# Patient Record
Sex: Female | Born: 1980 | Race: White | Hispanic: No | Marital: Married | Smoking: Never smoker
Health system: Southern US, Community
[De-identification: ages and names within clinical notes are randomized; demographics above are authoritative.]

## PROBLEM LIST (undated history)

## (undated) DIAGNOSIS — F410 Panic disorder [episodic paroxysmal anxiety] without agoraphobia: Secondary | ICD-10-CM

## (undated) DIAGNOSIS — R42 Dizziness and giddiness: Secondary | ICD-10-CM

## (undated) DIAGNOSIS — G43909 Migraine, unspecified, not intractable, without status migrainosus: Secondary | ICD-10-CM

## (undated) HISTORY — PX: CHOLECYSTECTOMY: SHX55

---

## 2004-04-20 ENCOUNTER — Emergency Department (HOSPITAL_COMMUNITY): Admission: EM | Admit: 2004-04-20 | Discharge: 2004-04-20 | Payer: Self-pay | Admitting: Emergency Medicine

## 2004-04-27 ENCOUNTER — Emergency Department (HOSPITAL_COMMUNITY): Admission: EM | Admit: 2004-04-27 | Discharge: 2004-04-27 | Payer: Self-pay | Admitting: Emergency Medicine

## 2004-07-17 ENCOUNTER — Emergency Department (HOSPITAL_COMMUNITY): Admission: EM | Admit: 2004-07-17 | Discharge: 2004-07-18 | Payer: Self-pay

## 2006-12-08 ENCOUNTER — Emergency Department (HOSPITAL_COMMUNITY): Admission: EM | Admit: 2006-12-08 | Discharge: 2006-12-08 | Payer: Self-pay | Admitting: Emergency Medicine

## 2008-10-18 ENCOUNTER — Emergency Department (HOSPITAL_COMMUNITY): Admission: EM | Admit: 2008-10-18 | Discharge: 2008-10-19 | Payer: Self-pay | Admitting: Emergency Medicine

## 2009-04-18 ENCOUNTER — Emergency Department (HOSPITAL_COMMUNITY): Admission: EM | Admit: 2009-04-18 | Discharge: 2009-04-18 | Payer: Self-pay | Admitting: Emergency Medicine

## 2009-04-20 ENCOUNTER — Emergency Department (HOSPITAL_COMMUNITY): Admission: EM | Admit: 2009-04-20 | Discharge: 2009-04-20 | Payer: Self-pay | Admitting: Emergency Medicine

## 2011-01-09 ENCOUNTER — Emergency Department (HOSPITAL_COMMUNITY): Payer: Self-pay

## 2011-01-09 ENCOUNTER — Emergency Department (HOSPITAL_COMMUNITY)
Admission: EM | Admit: 2011-01-09 | Discharge: 2011-01-09 | Disposition: A | Discharge status: Home / Self Care | Payer: Self-pay | Attending: Emergency Medicine | Admitting: Emergency Medicine

## 2011-01-09 DIAGNOSIS — S93409A Sprain of unspecified ligament of unspecified ankle, initial encounter: Secondary | ICD-10-CM | POA: Insufficient documentation

## 2011-01-09 DIAGNOSIS — W010XXA Fall on same level from slipping, tripping and stumbling without subsequent striking against object, initial encounter: Secondary | ICD-10-CM | POA: Insufficient documentation

## 2011-01-09 DIAGNOSIS — Y998 Other external cause status: Secondary | ICD-10-CM | POA: Insufficient documentation

## 2011-01-14 LAB — CBC
HCT: 41.7 % (ref 36.0–46.0)
Hemoglobin: 13.9 g/dL (ref 12.0–15.0)
MCHC: 33.3 g/dL (ref 30.0–36.0)
MCV: 89.2 fL (ref 78.0–100.0)
Platelets: 356 10*3/uL (ref 150–400)
RBC: 4.68 MIL/uL (ref 3.87–5.11)
RDW: 13.8 % (ref 11.5–15.5)
WBC: 14.2 10*3/uL — ABNORMAL HIGH (ref 4.0–10.5)

## 2011-01-14 LAB — DIFFERENTIAL
Basophils Absolute: 0.1 10*3/uL (ref 0.0–0.1)
Basophils Relative: 1 % (ref 0–1)
Eosinophils Absolute: 0.2 10*3/uL (ref 0.0–0.7)
Eosinophils Relative: 2 % (ref 0–5)
Lymphocytes Relative: 19 % (ref 12–46)
Lymphs Abs: 2.7 10*3/uL (ref 0.7–4.0)
Monocytes Absolute: 0.8 10*3/uL (ref 0.1–1.0)
Monocytes Relative: 6 % (ref 3–12)
Neutro Abs: 10.2 10*3/uL — ABNORMAL HIGH (ref 1.7–7.7)
Neutrophils Relative %: 72 % (ref 43–77)

## 2011-01-14 LAB — WET PREP, GENITAL
Trich, Wet Prep: NONE SEEN
WBC, Wet Prep HPF POC: NONE SEEN
Yeast Wet Prep HPF POC: NONE SEEN

## 2011-01-14 LAB — URINE MICROSCOPIC-ADD ON

## 2011-01-14 LAB — URINALYSIS, ROUTINE W REFLEX MICROSCOPIC
Bilirubin Urine: NEGATIVE
Glucose, UA: NEGATIVE mg/dL
Leukocytes, UA: NEGATIVE
Nitrite: NEGATIVE
Specific Gravity, Urine: 1.025 (ref 1.005–1.030)
Urobilinogen, UA: 0.2 mg/dL (ref 0.0–1.0)
pH: 6 (ref 5.0–8.0)

## 2011-01-14 LAB — GC/CHLAMYDIA PROBE AMP, GENITAL
Chlamydia, DNA Probe: NEGATIVE
GC Probe Amp, Genital: NEGATIVE

## 2011-01-14 LAB — RPR: RPR Ser Ql: NONREACTIVE

## 2011-01-14 LAB — PREGNANCY, URINE: Preg Test, Ur: NEGATIVE

## 2011-06-13 ENCOUNTER — Emergency Department (HOSPITAL_COMMUNITY)
Admission: EM | Admit: 2011-06-13 | Discharge: 2011-06-13 | Disposition: A | Discharge status: Home / Self Care | Payer: Self-pay | Attending: Emergency Medicine | Admitting: Emergency Medicine

## 2011-06-13 ENCOUNTER — Encounter: Payer: Self-pay | Admitting: Emergency Medicine

## 2011-06-13 DIAGNOSIS — N939 Abnormal uterine and vaginal bleeding, unspecified: Secondary | ICD-10-CM

## 2011-06-13 DIAGNOSIS — N920 Excessive and frequent menstruation with regular cycle: Secondary | ICD-10-CM | POA: Insufficient documentation

## 2011-06-13 LAB — URINALYSIS, ROUTINE W REFLEX MICROSCOPIC
Glucose, UA: NEGATIVE mg/dL
Ketones, ur: 15 mg/dL — AB
Leukocytes, UA: NEGATIVE
Nitrite: NEGATIVE
Protein, ur: NEGATIVE mg/dL
Specific Gravity, Urine: 1.02 (ref 1.005–1.030)
Urobilinogen, UA: 0.2 mg/dL (ref 0.0–1.0)
pH: 6.5 (ref 5.0–8.0)

## 2011-06-13 LAB — POCT I-STAT, CHEM 8
BUN: 8 mg/dL (ref 6–23)
Calcium, Ion: 1.16 mmol/L (ref 1.12–1.32)
Chloride: 106 mEq/L (ref 96–112)
Creatinine, Ser: 0.6 mg/dL (ref 0.50–1.10)
Glucose, Bld: 92 mg/dL (ref 70–99)
HCT: 44 % (ref 36.0–46.0)
Hemoglobin: 15 g/dL (ref 12.0–15.0)
Potassium: 3.7 mEq/L (ref 3.5–5.1)
Sodium: 140 mEq/L (ref 135–145)
TCO2: 20 mmol/L (ref 0–100)

## 2011-06-13 LAB — URINE MICROSCOPIC-ADD ON

## 2011-06-13 LAB — WET PREP, GENITAL
Clue Cells Wet Prep HPF POC: NONE SEEN
Trich, Wet Prep: NONE SEEN
Yeast Wet Prep HPF POC: NONE SEEN

## 2011-06-13 LAB — POCT PREGNANCY, URINE: Preg Test, Ur: NEGATIVE

## 2011-06-13 MED ORDER — IBUPROFEN 400 MG PO TABS
400.0000 mg | ORAL_TABLET | Freq: Once | ORAL | Status: AC
  Administered 2011-06-13: 400 mg via ORAL
  Filled 2011-06-13: qty 1

## 2011-06-13 NOTE — ED Notes (Signed)
Pt c/o abd cramping and heavy vaginal bleeding that started an hour ago.

## 2011-06-13 NOTE — ED Provider Notes (Signed)
History     CSN: [id number] Arrival date & time: 06/13/2011  4:21 PM   Chief Complaint  Patient presents with  . Abdominal Cramping  . Vaginal Bleeding     HPI Pt was seen at 1635.  Per pt, c/o gradual onset and persistence of constant vaginal bleeding that began a few hours PTA.  Pt describes the vaginal bleeding as "heavy," has been assoc with abd/pelvic "cramping" pain.  Has hx of irregular menses, unsure if this is per the time of her usual menses.  Denies CP/SOB, no fevers, no back pain, no N/V/D.   History reviewed. No pertinent past medical history.   Past Surgical History  Procedure Date  . Cholecystectomy     History  Substance Use Topics  . Smoking status: Never Smoker   . Smokeless tobacco: Not on file  . Alcohol Use: No    Review of Systems ROS: Statement: All systems negative except as marked or noted in the HPI; Constitutional: Negative for fever and chills. ; ; Eyes: Negative for eye pain, redness and discharge. ; ; ENMT: Negative for ear pain, hoarseness, nasal congestion, sinus pressure and sore throat. ; ; Cardiovascular: Negative for chest pain, palpitations, diaphoresis, dyspnea and peripheral edema. ; ; Respiratory: Negative for cough, wheezing and stridor. ; ; Gastrointestinal: +abd/pelvic cramping.  Negative for nausea, vomiting, diarrhea, blood in stool, hematemesis, jaundice and rectal bleeding. . ; ; Genitourinary: Negative for dysuria, flank pain and hematuria. +vaginal bleeding. ; ; Musculoskeletal: Negative for back pain and neck pain. Negative for swelling and trauma.; ; Skin: Negative for pruritus, rash, abrasions, blisters, bruising and skin lesion.; ; Neuro: Negative for headache, lightheadedness and neck stiffness. Negative for weakness, altered level of consciousness , altered mental status, extremity weakness, paresthesias, involuntary movement, seizure and syncope.     Physical Exam    BP 130/81  Pulse 93  Temp(Src) 99.2 F (37.3 C) (Oral)   Resp 20  Ht 5\' 2"  (1.575 m)  Wt 265 lb (120.203 kg)  BMI 48.47 kg/m2  SpO2 98%  LMP 06/12/2011  Physical Exam 1640: Physical examination:  Nursing notes reviewed; Vital signs and O2 SAT reviewed;  Constitutional: Well developed, Well nourished, Well hydrated, In no acute distress; Head:  Normocephalic, atraumatic; Eyes: EOMI, PERRL, No scleral icterus; ENMT: Mouth and pharynx normal, Mucous membranes moist; Neck: Supple, Full range of motion, No lymphadenopathy; Cardiovascular: Regular rate and rhythm, No murmur, rub, or gallop; Respiratory: Breath sounds clear & equal bilaterally, No rales, rhonchi, wheezes, or rub, Normal respiratory effort/excursion; Chest: Nontender, Movement normal; Abdomen: Soft, Nontender, Nondistended, Normal bowel sounds; Pelvic exam performed with permission of pt and female ED tech assist during exam.  External genitalia w/o lesions. Vaginal vault without discharge, small amount of dark blood in vault.  Cervix w/o lesions, not friable, GC/chlam and wet prep obtained and sent to lab.  Bimanual exam w/o CMT, uterine or adenexal tenderness. Extremities: Pulses normal, No tenderness, No edema, No calf edema or asymmetry.; Neuro: AA&Ox3, Major CN grossly intact. Speech clear, no facial droop.  No gross focal motor or sensory deficits in extremities.; Skin: Color normal, Warm, Dry.    ED Course  Procedures   MDM MDM Reviewed: nursing note and vitals Interpretation: labs   Results for orders placed during the hospital encounter of 06/13/11  URINALYSIS, ROUTINE W REFLEX MICROSCOPIC      Component Value Range   Color, Urine YELLOW  YELLOW    Appearance CLEAR  CLEAR    Specific  Gravity, Urine 1.020  1.005 - 1.030    pH 6.5  5.0 - 8.0    Glucose, UA NEGATIVE  NEGATIVE (mg/dL)   Hgb urine dipstick MODERATE (*) NEGATIVE    Bilirubin Urine SMALL (*) NEGATIVE    Ketones, ur 15 (*) NEGATIVE (mg/dL)   Protein, ur NEGATIVE  NEGATIVE (mg/dL)   Urobilinogen, UA 0.2  0.0 -  1.0 (mg/dL)   Nitrite NEGATIVE  NEGATIVE    Leukocytes, UA NEGATIVE  NEGATIVE   WET PREP, GENITAL      Component Value Range   Yeast, Wet Prep NONE SEEN  NONE SEEN    Trich, Wet Prep NONE SEEN  NONE SEEN    Clue Cells, Wet Prep NONE SEEN  NONE SEEN    WBC, Wet Prep HPF POC FEW (*) NONE SEEN   POCT PREGNANCY, URINE      Component Value Range   Preg Test, Ur NEGATIVE    URINE MICROSCOPIC-ADD ON      Component Value Range   Squamous Epithelial / LPF FEW (*) RARE    WBC, UA 0-2  <3 (WBC/hpf)   RBC / HPF 3-6  <3 (RBC/hpf)   Bacteria, UA RARE  RARE   POCT I-STAT, CHEM 8      Component Value Range   Sodium 140  135 - 145 (mEq/L)   Potassium 3.7  3.5 - 5.1 (mEq/L)   Chloride 106  96 - 112 (mEq/L)   BUN 8  6 - 23 (mg/dL)   Creatinine, Ser 1.61  0.50 - 1.10 (mg/dL)   Glucose, Bld 92  70 - 99 (mg/dL)   Calcium, Ion 0.96  0.45 - 1.32 (mmol/L)   TCO2 20  0 - 100 (mmol/L)   Hemoglobin 15.0  12.0 - 15.0 (g/dL)   HCT 40.9  81.1 - 91.4 (%)    6:43 PM:  Has menses, UA contaminated.  H/H normal, VSS.  Wants to go home now.  Dx testing d/w pt and family.  Questions answered.  Verb understanding, agreeable to d/c home with outpt f/u.    I personally performed the services described in this documentation, which was scribed in my presence. The recorded information has been reviewed and considered. [facility] ([place]) M    Verl Whitmore Allison Quarry, DO 06/13/11 2313

## 2011-06-13 NOTE — ED Notes (Signed)
Pt states did not have vaginal bleeding yesterday and today started cramping with excessive bleeding. Pt states was "thinking she was pregnant and planned on buying a pregnancy test this weekend. Pt continues to have excessive bleeding using 5 sanitary napkins since 2 pm. Pt states stomach cramping is still intense.

## 2011-06-14 LAB — GC/CHLAMYDIA PROBE AMP, GENITAL
Chlamydia, DNA Probe: NEGATIVE
GC Probe Amp, Genital: NEGATIVE

## 2011-08-26 ENCOUNTER — Emergency Department (HOSPITAL_COMMUNITY)
Admission: EM | Admit: 2011-08-26 | Discharge: 2011-08-26 | Disposition: A | Discharge status: Home / Self Care | Payer: Self-pay | Attending: Emergency Medicine | Admitting: Emergency Medicine

## 2011-08-26 ENCOUNTER — Encounter (HOSPITAL_COMMUNITY): Payer: Self-pay | Admitting: *Deleted

## 2011-08-26 DIAGNOSIS — R300 Dysuria: Secondary | ICD-10-CM | POA: Insufficient documentation

## 2011-08-26 DIAGNOSIS — R109 Unspecified abdominal pain: Secondary | ICD-10-CM | POA: Insufficient documentation

## 2011-08-26 DIAGNOSIS — N390 Urinary tract infection, site not specified: Secondary | ICD-10-CM | POA: Insufficient documentation

## 2011-08-26 DIAGNOSIS — R350 Frequency of micturition: Secondary | ICD-10-CM | POA: Insufficient documentation

## 2011-08-26 LAB — URINALYSIS, ROUTINE W REFLEX MICROSCOPIC
Bilirubin Urine: NEGATIVE
Glucose, UA: NEGATIVE mg/dL
Ketones, ur: NEGATIVE mg/dL
Leukocytes, UA: NEGATIVE
Nitrite: NEGATIVE
Protein, ur: NEGATIVE mg/dL
Specific Gravity, Urine: 1.02 (ref 1.005–1.030)
Urobilinogen, UA: 0.2 mg/dL (ref 0.0–1.0)
pH: 6.5 (ref 5.0–8.0)

## 2011-08-26 LAB — PREGNANCY, URINE: Preg Test, Ur: NEGATIVE

## 2011-08-26 LAB — URINE MICROSCOPIC-ADD ON

## 2011-08-26 MED ORDER — NITROFURANTOIN MONOHYD MACRO 100 MG PO CAPS
100.0000 mg | ORAL_CAPSULE | Freq: Once | ORAL | Status: AC
  Administered 2011-08-26: 100 mg via ORAL
  Filled 2011-08-26: qty 1

## 2011-08-26 MED ORDER — PHENAZOPYRIDINE HCL 100 MG PO TABS
200.0000 mg | ORAL_TABLET | Freq: Once | ORAL | Status: AC
  Administered 2011-08-26: 200 mg via ORAL
  Filled 2011-08-26: qty 2

## 2011-08-26 MED ORDER — NITROFURANTOIN MONOHYD MACRO 100 MG PO CAPS: 100.0000 mg | ORAL_CAPSULE | Freq: Two times a day (BID) | ORAL | Status: AC

## 2011-08-26 MED ORDER — PHENAZOPYRIDINE HCL 200 MG PO TABS: 200.0000 mg | ORAL_TABLET | Freq: Three times a day (TID) | ORAL | Status: AC

## 2011-08-26 NOTE — ED Provider Notes (Signed)
History     CSN: [id number] Arrival date & time: 08/26/2011  2:21 PM   First MD Initiated Contact with Patient 08/26/11 1438      Chief Complaint  Patient presents with  . Urinary Frequency    (Consider location/radiation/quality/duration/timing/severity/associated sxs/prior treatment) Patient is a 30 y.o. female presenting with frequency. The history is provided by the patient. No language interpreter was used.  Urinary Frequency This is a new problem. Episode onset: 3-4 days ago. The problem occurs constantly. The problem has been gradually worsening. Associated symptoms include urinary symptoms. Pertinent negatives include no diaphoresis, fever, nausea, numbness or rash. Exacerbated by: palpation. She has tried nothing for the symptoms.    History reviewed. No pertinent past medical history.  Past Surgical History  Procedure Date  . Cholecystectomy     History reviewed. No pertinent family history.  History  Substance Use Topics  . Smoking status: Never Smoker   . Smokeless tobacco: Not on file  . Alcohol Use: No    OB History    Grav Para Term Preterm Abortions TAB SAB Ect Mult Living                  Review of Systems  Constitutional: Negative for fever and diaphoresis.  Gastrointestinal: Negative for nausea.  Genitourinary: Positive for dysuria, urgency and frequency. Negative for hematuria.  Skin: Negative for rash.  Neurological: Negative for numbness.  All other systems reviewed and are negative.    Allergies  Zithromax  Home Medications   Current Outpatient Rx  Name Route Sig Dispense Refill  . ACETAMINOPHEN 500 MG PO TABS Oral Take 500 mg by mouth as needed. For headache pain     . THERA M PLUS PO TABS Oral Take 1 tablet by mouth daily.        BP 122/57  Pulse 85  Temp(Src) 98.4 F (36.9 C) (Oral)  Resp 24  Ht 5\' 1"  (1.549 m)  Wt 269 lb (122.018 kg)  BMI 50.83 kg/m2  SpO2 99%  LMP 07/26/2011  Physical Exam  Nursing note and vitals  reviewed. Constitutional: She is oriented to person, place, and time. She appears well-developed and well-nourished. No distress.  HENT:  Head: Normocephalic and atraumatic.  Eyes: EOM are normal.  Neck: Normal range of motion.  Cardiovascular: Normal rate, regular rhythm and normal heart sounds.   Pulmonary/Chest: Effort normal and breath sounds normal.  Abdominal: Soft. Normal appearance and bowel sounds are normal. She exhibits no distension. There is no hepatosplenomegaly. There is tenderness in the suprapubic area. There is no rigidity, no rebound, no guarding and no CVA tenderness.    Genitourinary: No vaginal discharge found.  Musculoskeletal: Normal range of motion.  Neurological: She is alert and oriented to person, place, and time.  Skin: Skin is warm and dry. She is not diaphoretic.  Psychiatric: She has a normal mood and affect. Judgment normal.    ED Course  Procedures (including critical care time)  Labs Reviewed  URINALYSIS, ROUTINE W REFLEX MICROSCOPIC - Abnormal; Notable for the following:    Hgb urine dipstick TRACE (*)    All other components within normal limits  URINE MICROSCOPIC-ADD ON - Abnormal; Notable for the following:    Squamous Epithelial / LPF MANY (*)    Bacteria, UA MANY (*)    All other components within normal limits  PREGNANCY, URINE  URINE CULTURE   No results found.   No diagnosis found.    MDM  Worthy Rancher, PA 08/26/11 [phone]

## 2011-08-26 NOTE — ED Notes (Signed)
Pt c/o urinary frequency, painful urination, lower abdominal pain and back pain since Saturday.

## 2011-08-27 NOTE — ED Provider Notes (Signed)
Medical screening examination/treatment/procedure(s) were performed by non-physician practitioner and as supervising physician I was immediately available for consultation/collaboration.  Toy Baker, MD 08/27/11 2136

## 2011-08-28 LAB — URINE CULTURE
Colony Count: NO GROWTH
Culture  Setup Time: 201211272157
Culture: NO GROWTH

## 2011-12-24 ENCOUNTER — Encounter (HOSPITAL_COMMUNITY): Payer: Self-pay | Admitting: *Deleted

## 2011-12-24 ENCOUNTER — Emergency Department (HOSPITAL_COMMUNITY)
Admission: EM | Admit: 2011-12-24 | Discharge: 2011-12-24 | Disposition: A | Discharge status: Home / Self Care | Payer: Self-pay | Attending: Emergency Medicine | Admitting: Emergency Medicine

## 2011-12-24 ENCOUNTER — Other Ambulatory Visit: Payer: Self-pay

## 2011-12-24 ENCOUNTER — Emergency Department (HOSPITAL_COMMUNITY): Payer: Self-pay

## 2011-12-24 DIAGNOSIS — R079 Chest pain, unspecified: Secondary | ICD-10-CM | POA: Insufficient documentation

## 2011-12-24 DIAGNOSIS — R05 Cough: Secondary | ICD-10-CM | POA: Insufficient documentation

## 2011-12-24 DIAGNOSIS — R059 Cough, unspecified: Secondary | ICD-10-CM | POA: Insufficient documentation

## 2011-12-24 HISTORY — DX: Panic disorder (episodic paroxysmal anxiety): F41.0

## 2011-12-24 NOTE — ED Notes (Signed)
Patient reports onset of fluttering and pain in her chest last night.  She states it went away and returned today.  Patient denies sob.

## 2011-12-24 NOTE — ED Provider Notes (Signed)
I saw and evaluated the patient, reviewed the resident's note and I agree with the findings and plan.  31 year old morbidly obese female, with productive cough, and intermittent left-sided chest pain, for a few days.  She denies nausea, vomiting, fevers, chills, leg pain or swelling.  She has not had recent trip or surgery and she denies using birth control pills.  She is a nonsmoker.  On examination.  Her lungs are clear.  She does have left sided abdominal tenderness to palpation she is in no distress.  We'll perform a chest x-ray, to look for pneumonia, versus bronchitis, and then decide upon treatment.  She will be able to go home  Cheri Guppy, MD 12/24/11 1024

## 2011-12-24 NOTE — ED Provider Notes (Signed)
I saw and evaluated the patient, reviewed the resident's note and I agree with the findings and plan.  Keimani Laufer, MD 12/24/11 1600 

## 2011-12-24 NOTE — ED Provider Notes (Signed)
History     CSN: [id number]  Arrival date & time 12/24/11  4098   First MD Initiated Contact with Patient 12/24/11 [phone]      Chief Complaint  Patient presents with  . Chest Pain  . Palpitations    (Consider location/radiation/quality/duration/timing/severity/associated sxs/prior treatment) HPI CC chest pain onset last night, moderate, sharp, left sided, associated with cough.  Pt also states she has felt heart fluttering as well related to the pain.  Pt denies sob.  Past Medical History  Diagnosis Date  . Panic attacks     Past Surgical History  Procedure Date  . Cholecystectomy     No family history on file.  History  Substance Use Topics  . Smoking status: Never Smoker   . Smokeless tobacco: Not on file  . Alcohol Use: No    OB History    Grav Para Term Preterm Abortions TAB SAB Ect Mult Living                  Review of Systems  Constitutional: Negative for fever and chills.  All other systems reviewed and are negative.    Allergies  Zithromax  Home Medications   Current Outpatient Rx  Name Route Sig Dispense Refill  . ACETAMINOPHEN 500 MG PO TABS Oral Take 500 mg by mouth as needed. For headache pain     . THERA M PLUS PO TABS Oral Take 1 tablet by mouth daily.        BP 103/67  Pulse 76  Temp(Src) 98.9 F (37.2 C) (Oral)  Resp 16  Ht 5\' 1"  (1.549 m)  Wt 260 lb (117.935 kg)  BMI 49.13 kg/m2  SpO2 100%ra wnl  Physical Exam  Nursing note and vitals reviewed. Constitutional: She appears well-developed and well-nourished.  HENT:  Head: Normocephalic and atraumatic.  Eyes: Right eye exhibits no discharge. Left eye exhibits no discharge.  Cardiovascular: Normal rate, regular rhythm and normal heart sounds.   Pulmonary/Chest: Effort normal and breath sounds normal.  Abdominal: Soft. There is no tenderness.  Neurological: She is alert.  Skin: Skin is warm and dry.    ED Course  Procedures (including critical care time)  Labs Reviewed  - No data to display Dg Chest 2 View  12/24/2011  *RADIOLOGY REPORT*  Clinical Data: Chest pain, palpitations.  CHEST - 2 VIEW  Comparison: None  Findings: Low lung volumes. Heart and mediastinal contours are within normal limits.  No focal opacities or effusions.  No acute bony abnormality.  IMPRESSION: Low volumes. No active cardiopulmonary disease.  Original Report Authenticated By: Cyndie Chime, M.D.     1. Cough   2. Chest pain       MDM  Pt is in nad, afvss, nontoxic appearing, exam and hx as above. PERC negative.  EKG nl, getting cxr.  cxr nl, results and plan d/w pt, return warnings given      Elijio Miles, MD 12/24/11 1036

## 2011-12-24 NOTE — Discharge Instructions (Signed)
Chest Pain, Nonspecific It is often hard to give a specific diagnosis for the cause of chest pain. There is always a chance that your pain could be related to something serious, like a heart attack or a blood clot in the lungs. You need to follow up with your caregiver for further evaluation. More lab tests or other studies such as X-rays, electrocardiography, stress testing, or cardiac imaging may be needed to find the cause of your pain. Most of the time, nonspecific chest pain improves within 2 to 3 days with rest and mild pain medicine. For the next few days, avoid physical exertion or activities that bring on pain. Do not smoke. Avoid drinking alcohol. Call your caregiver for routine follow-up as advised.  SEEK IMMEDIATE MEDICAL CARE IF:  You develop increased chest pain or pain that radiates to the arm, neck, jaw, back, or abdomen.   You develop shortness of breath, increased coughing, or you start coughing up blood.   You have severe back or abdominal pain, nausea, or vomiting.   You develop severe weakness, fainting, fever, or chills.  Document Released: 09/16/2005 Document Revised: 09/05/2011 Document Reviewed: 03/06/2007 [facility] Patient Information 2012 [place], [place].Cough, Adult  A cough is a reflex. It helps you clear your throat and airways. A cough can help heal your body. A cough can last 2 or 3 weeks (acute) or may last more than 8 weeks (chronic). Some common causes of a cough can include an infection, allergy, or a cold. HOME CARE  Only take medicine as told by your doctor.   If given, take your medicines (antibiotics) as told. Finish them even if you start to feel better.   Use a cold steam vaporizer or humidier in your home. This can help loosen thick spit (secretions).   Sleep so you are almost sitting up (semi-upright). Use pillows to do this. This helps reduce coughing.   Rest as needed.   Stop smoking if you smoke.  GET HELP RIGHT AWAY IF:  You have  yellowish-white fluid (pus) in your thick spit.   Your cough gets worse.   Your medicine does not reduce coughing, and you are losing sleep.   You cough up blood.   You have trouble breathing.   Your pain gets worse and medicine does not help.   You have a fever.  MAKE SURE YOU:   Understand these instructions.   Will watch your condition.   Will get help right away if you are not doing well or get worse.  Document Released: 05/30/2011 Document Revised: 09/05/2011 Document Reviewed: 05/30/2011 [facility] Patient Information 2012 [place], [place].  Return for any new or worsening symptoms or any other concerns.

## 2012-01-16 ENCOUNTER — Encounter (HOSPITAL_COMMUNITY): Payer: Self-pay | Admitting: Emergency Medicine

## 2012-01-16 ENCOUNTER — Emergency Department (HOSPITAL_COMMUNITY): Payer: Self-pay

## 2012-01-16 ENCOUNTER — Emergency Department (HOSPITAL_COMMUNITY)
Admission: EM | Admit: 2012-01-16 | Discharge: 2012-01-16 | Disposition: A | Discharge status: Home / Self Care | Payer: Self-pay | Attending: Emergency Medicine | Admitting: Emergency Medicine

## 2012-01-16 DIAGNOSIS — IMO0002 Reserved for concepts with insufficient information to code with codable children: Secondary | ICD-10-CM | POA: Insufficient documentation

## 2012-01-16 DIAGNOSIS — M79609 Pain in unspecified limb: Secondary | ICD-10-CM | POA: Insufficient documentation

## 2012-01-16 DIAGNOSIS — M545 Low back pain, unspecified: Secondary | ICD-10-CM | POA: Insufficient documentation

## 2012-01-16 DIAGNOSIS — M5416 Radiculopathy, lumbar region: Secondary | ICD-10-CM

## 2012-01-16 MED ORDER — PREDNISONE 50 MG PO TABS: 50.0000 mg | ORAL_TABLET | Freq: Every day | ORAL | Status: AC

## 2012-01-16 MED ORDER — PREDNISONE 20 MG PO TABS
60.0000 mg | ORAL_TABLET | Freq: Once | ORAL | Status: AC
  Administered 2012-01-16: 60 mg via ORAL
  Filled 2012-01-16: qty 3

## 2012-01-16 MED ORDER — HYDROCODONE-ACETAMINOPHEN 5-325 MG PO TABS
1.0000 | ORAL_TABLET | Freq: Once | ORAL | Status: AC
  Administered 2012-01-16: 1 via ORAL
  Filled 2012-01-16: qty 1

## 2012-01-16 MED ORDER — HYDROCODONE-ACETAMINOPHEN 5-325 MG PO TABS: 1.0000 | ORAL_TABLET | Freq: Four times a day (QID) | ORAL | Status: AC | PRN

## 2012-01-16 NOTE — ED Provider Notes (Signed)
Medical screening examination/treatment/procedure(s) were performed by non-physician practitioner and as supervising physician I was immediately available for consultation/collaboration.  Flint Melter, MD 01/16/12 [phone]

## 2012-01-16 NOTE — ED Notes (Signed)
Pt states back and right leg pain since yesterday. Pain shooting down leg. Pt states has had before but not this bad.

## 2012-01-16 NOTE — ED Notes (Signed)
Pt alert and oriented x 3. Skin warm and dry. Color pink. Pt c/o pain in her lower back that radiates down her right leg since yesterday. Denies injury.

## 2012-01-16 NOTE — Discharge Instructions (Signed)
Back Pain, Adult Low back pain is very common. About 1 in 5 people have back pain.The cause of low back pain is rarely dangerous. The pain often gets better over time.About half of people with a sudden onset of back pain feel better in just 2 weeks. About 8 in 10 people feel better by 6 weeks.  CAUSES Some common causes of back pain include:  Strain of the muscles or ligaments supporting the spine.   Wear and tear (degeneration) of the spinal discs.   Arthritis.   Direct injury to the back.  DIAGNOSIS Most of the time, the direct cause of low back pain is not known.However, back pain can be treated effectively even when the exact cause of the pain is unknown.Answering your caregiver's questions about your overall health and symptoms is one of the most accurate ways to make sure the cause of your pain is not dangerous. If your caregiver needs more information, he or she may order lab work or imaging tests (X-rays or MRIs).However, even if imaging tests show changes in your back, this usually does not require surgery. HOME CARE INSTRUCTIONS For many people, back pain returns.Since low back pain is rarely dangerous, it is often a condition that people can learn to manageon their own.   Remain active. It is stressful on the back to sit or stand in one place. Do not sit, drive, or stand in one place for more than 30 minutes at a time. Take short walks on level surfaces as soon as pain allows.Try to increase the length of time you walk each day.   Do not stay in bed.Resting more than 1 or 2 days can delay your recovery.   Do not avoid exercise or work.Your body is made to move.It is not dangerous to be active, even though your back may hurt.Your back will likely heal faster if you return to being active before your pain is gone.   Pay attention to your body when you bend and lift. Many people have less discomfortwhen lifting if they bend their knees, keep the load close to their  bodies,and avoid twisting. Often, the most comfortable positions are those that put less stress on your recovering back.   Find a comfortable position to sleep. Use a firm mattress and lie on your side with your knees slightly bent. If you lie on your back, put a pillow under your knees.   Only take over-the-counter or prescription medicines as directed by your caregiver. Over-the-counter medicines to reduce pain and inflammation are often the most helpful.Your caregiver may prescribe muscle relaxant drugs.These medicines help dull your pain so you can more quickly return to your normal activities and healthy exercise.   Put ice on the injured area.   Put ice in a plastic bag.   Place a towel between your skin and the bag.   Leave the ice on for 15 to 20 minutes, 3 to 4 times a day for the first 2 to 3 days. After that, ice and heat may be alternated to reduce pain and spasms.   Ask your caregiver about trying back exercises and gentle massage. This may be of some benefit.   Avoid feeling anxious or stressed.Stress increases muscle tension and can worsen back pain.It is important to recognize when you are anxious or stressed and learn ways to manage it.Exercise is a great option.  SEEK MEDICAL CARE IF:  You have pain that is not relieved with rest or medicine.   You have   pain that does not improve in 1 week.   You have new symptoms.   You are generally not feeling well.  SEEK IMMEDIATE MEDICAL CARE IF:   You have pain that radiates from your back into your legs.   You develop new bowel or bladder control problems.   You have unusual weakness or numbness in your arms or legs.   You develop nausea or vomiting.   You develop abdominal pain.   You feel faint.  Document Released: 09/16/2005 Document Revised: 09/05/2011 Document Reviewed: 02/04/2011 [facility] Patient Information 2012 [employer], [place].   The radiologist notes some mild degenerative changes but otherwise no  significant abnormalities.  Take the meds as directed.  Do NOT take any NSAID's while taking the prednisone.  Apply ice several times daily to lower back.  Make arrangements for follow up at the health dept.

## 2012-01-16 NOTE — ED Provider Notes (Signed)
History     CSN: [id number]  Arrival date & time 01/16/12  4098   First MD Initiated Contact with Patient 01/16/12 [phone]      Chief Complaint  Patient presents with  . Back Pain  . Leg Pain    (Consider location/radiation/quality/duration/timing/severity/associated sxs/prior treatment) HPI Comments: States she has had intermittent/episodic low back pain since falling off a trampoline about 15 yrs ago.  No recent trauma.  LMP last month.  "i am not pregnant".    Patient is a 31 y.o. female presenting with back pain and leg pain. The history is provided by the patient. No language interpreter was used.  Back Pain  This is a new problem. The current episode started yesterday. The problem occurs constantly. The problem has been gradually worsening. The pain is associated with no known injury. The pain is present in the lumbar spine and gluteal region. Quality: nagging. The pain radiates to the right foot. The pain is severe. The pain is the same all the time. Associated symptoms include leg pain. Pertinent negatives include no numbness, no bowel incontinence, no perianal numbness, no paresthesias, no paresis, no tingling and no weakness. The treatment provided no (took 1 "tinazadine" last PM.  one of her husband's muscle relaxants.) relief.  Leg Pain  Pertinent negatives include no numbness and no tingling.    Past Medical History  Diagnosis Date  . Panic attacks     Past Surgical History  Procedure Date  . Cholecystectomy     No family history on file.  History  Substance Use Topics  . Smoking status: Never Smoker   . Smokeless tobacco: Not on file  . Alcohol Use: No    OB History    Grav Para Term Preterm Abortions TAB SAB Ect Mult Living                  Review of Systems  Gastrointestinal: Negative for bowel incontinence.  Musculoskeletal: Positive for back pain.  Neurological: Negative for tingling, weakness, numbness and paresthesias.  All other systems reviewed  and are negative.    Allergies  Zithromax  Home Medications   Current Outpatient Rx  Name Route Sig Dispense Refill  . ACETAMINOPHEN 500 MG PO TABS Oral Take 500 mg by mouth as needed. For headache pain     . HYDROCODONE-ACETAMINOPHEN 5-325 MG PO TABS Oral Take 1 tablet by mouth every 6 (six) hours as needed for pain. 20 tablet 0  . PREDNISONE 50 MG PO TABS Oral Take 1 tablet (50 mg total) by mouth daily. 6 tablet 0    BP 141/97  Pulse 80  Temp(Src) 98.3 F (36.8 C) (Oral)  Resp 20  Ht 5\' 2"  (1.575 m)  Wt 260 lb (117.935 kg)  BMI 47.55 kg/m2  SpO2 98%  LMP 12/09/2011  Physical Exam  Nursing note and vitals reviewed. Constitutional: She is oriented to person, place, and time. She appears well-developed and well-nourished. No distress.  HENT:  Head: Normocephalic and atraumatic.  Eyes: EOM are normal.  Neck: Normal range of motion.  Cardiovascular: Normal rate, regular rhythm and normal heart sounds.   Pulmonary/Chest: Effort normal and breath sounds normal.  Abdominal: Soft. She exhibits no distension. There is no tenderness.  Musculoskeletal: Normal range of motion.       Arms: Neurological: She is alert and oriented to person, place, and time. She has normal strength. No sensory deficit. Coordination normal. GCS eye subscore is 4. GCS verbal subscore is 5. GCS motor  subscore is 6.  Reflex Scores:      Patellar reflexes are 2+ on the right side and 2+ on the left side.      Achilles reflexes are 2+ on the right side and 2+ on the left side. Skin: Skin is warm and dry.  Psychiatric: She has a normal mood and affect. Judgment normal.    ED Course  Procedures (including critical care time)  Labs Reviewed - No data to display Dg Lumbar Spine Complete  01/16/2012  *RADIOLOGY REPORT*  Clinical Data: Back and right leg pain.  LUMBAR SPINE - COMPLETE 4+ VIEW  Comparison: None.  Findings: Alignment is anatomic.  Vertebral body height is maintained.  Mild endplate  degenerative changes in the lower thoracic spine, as well as at L3-4, where there is also mild loss of disc space height.  There may be mild loss of disc space height at L5-S1 also.  No definite pars defects.  IMPRESSION: Mild scattered spondylosis.  Original Report Authenticated By: Reyes Ivan, M.D.     1. Lumbar back pain   2. Lumbar radiculopathy       MDM  No significant bony abnorm Ice rx-predisone 50 mg, 6 rx- hydrocodone, 20 F/u with PCP at [facility], [place] 01/16/12 1126  Worthy Rancher, PA 01/16/12 [phone]

## 2012-01-28 ENCOUNTER — Emergency Department (HOSPITAL_COMMUNITY)
Admission: EM | Admit: 2012-01-28 | Discharge: 2012-01-28 | Disposition: A | Discharge status: Home / Self Care | Payer: Self-pay | Attending: Emergency Medicine | Admitting: Emergency Medicine

## 2012-01-28 ENCOUNTER — Encounter (HOSPITAL_COMMUNITY): Payer: Self-pay

## 2012-01-28 DIAGNOSIS — R509 Fever, unspecified: Secondary | ICD-10-CM | POA: Insufficient documentation

## 2012-01-28 DIAGNOSIS — R112 Nausea with vomiting, unspecified: Secondary | ICD-10-CM | POA: Insufficient documentation

## 2012-01-28 DIAGNOSIS — R51 Headache: Secondary | ICD-10-CM | POA: Insufficient documentation

## 2012-01-28 LAB — CBC
Hemoglobin: 14.4 g/dL (ref 12.0–15.0)
MCH: 30.3 pg (ref 26.0–34.0)
MCHC: 34.3 g/dL (ref 30.0–36.0)
Platelets: 257 10*3/uL (ref 150–400)
RDW: 13.7 % (ref 11.5–15.5)

## 2012-01-28 LAB — PREGNANCY, URINE: Preg Test, Ur: NEGATIVE

## 2012-01-28 LAB — DIFFERENTIAL
Basophils Absolute: 0.1 10*3/uL (ref 0.0–0.1)
Basophils Relative: 1 % (ref 0–1)
Eosinophils Absolute: 0.1 10*3/uL (ref 0.0–0.7)
Monocytes Relative: 10 % (ref 3–12)
Neutro Abs: 8.6 10*3/uL — ABNORMAL HIGH (ref 1.7–7.7)
Neutrophils Relative %: 79 % — ABNORMAL HIGH (ref 43–77)

## 2012-01-28 LAB — URINALYSIS, ROUTINE W REFLEX MICROSCOPIC
Ketones, ur: NEGATIVE mg/dL
Leukocytes, UA: NEGATIVE
Protein, ur: NEGATIVE mg/dL
Urobilinogen, UA: 0.2 mg/dL (ref 0.0–1.0)

## 2012-01-28 LAB — BASIC METABOLIC PANEL
BUN: 9 mg/dL (ref 6–23)
Calcium: 8.8 mg/dL (ref 8.4–10.5)
GFR calc Af Amer: >90 mL/min (ref >90)
GFR calc non Af Amer: >90 mL/min (ref >90)
Potassium: 3.8 mEq/L (ref 3.5–5.1)

## 2012-01-28 LAB — URINE MICROSCOPIC-ADD ON

## 2012-01-28 MED ORDER — SODIUM CHLORIDE 0.9 % IV SOLN
Freq: Once | INTRAVENOUS | Status: AC
  Administered 2012-01-28: 11:00:00 via INTRAVENOUS

## 2012-01-28 MED ORDER — METOCLOPRAMIDE HCL 5 MG/ML IJ SOLN
10.0000 mg | Freq: Once | INTRAMUSCULAR | Status: AC
  Administered 2012-01-28: 10 mg via INTRAVENOUS
  Filled 2012-01-28: qty 2

## 2012-01-28 MED ORDER — DIPHENHYDRAMINE HCL 50 MG/ML IJ SOLN
25.0000 mg | Freq: Once | INTRAMUSCULAR | Status: AC
  Administered 2012-01-28: 25 mg via INTRAVENOUS
  Filled 2012-01-28: qty 1

## 2012-01-28 MED ORDER — SODIUM CHLORIDE 0.9 % IV BOLUS (SEPSIS)
1000.0000 mL | Freq: Once | INTRAVENOUS | Status: AC
  Administered 2012-01-28 (×2): 1000 mL via INTRAVENOUS

## 2012-01-28 MED ORDER — METOCLOPRAMIDE HCL 10 MG PO TABS: 10.0000 mg | ORAL_TABLET | Freq: Four times a day (QID) | ORAL | Status: DC | PRN

## 2012-01-28 MED ORDER — SODIUM CHLORIDE 0.9 % IV SOLN: Freq: Once | INTRAVENOUS | Status: DC

## 2012-01-28 NOTE — ED Provider Notes (Signed)
History   This chart was scribed for Dione Booze, MD by Sofie Rower. The patient was seen in room APA09/APA09 and the patient's care was started at 10:12 AM     CSN: [id number]  Arrival date & time 01/28/12  4098   First MD Initiated Contact with Patient 01/28/12 1009      Chief Complaint  Patient presents with  . Nausea  . Emesis  . Fever    (Consider location/radiation/quality/duration/timing/severity/associated sxs/prior treatment) Patient is a 31 y.o. female presenting with vomiting and fever.  Emesis  Associated symptoms include a fever.  Fever Primary symptoms of the febrile illness include fever and vomiting.    Brandy Hoover is a 31 y.o. female who presents to the Emergency Department complaining of moderate, episodic fever (100.9) onset yesterday with associated symptoms of nausea , vomiting, headache, loss of appetite. The pt states "the headache is a 4/10". The pt informs the EDP that "the last time she vomited was this morning, I have not been able to keep anything down." Pt has a hx of panic attacks, cholecystectomy.   Pt denies any other medical problems.   [facility]   Past Medical History  Diagnosis Date  . Panic attacks     Past Surgical History  Procedure Date  . Cholecystectomy     No family history on file.  History  Substance Use Topics  . Smoking status: Never Smoker   . Smokeless tobacco: Not on file  . Alcohol Use: No    OB History    Grav Para Term Preterm Abortions TAB SAB Ect Mult Living                  Review of Systems  Constitutional: Positive for fever.  Gastrointestinal: Positive for vomiting.  All other systems reviewed and are negative.    10 Systems reviewed and all are negative for acute change except as noted in the HPI.    Allergies  Zithromax  Home Medications   Current Outpatient Rx  Name Route Sig Dispense Refill  . ACETAMINOPHEN 500 MG PO TABS Oral Take 1,000 mg by mouth every 6 (six)  hours as needed. For headache pain      BP 135/78  Pulse 112  Temp(Src) 99.5 F (37.5 C) (Oral)  Resp 20  Ht 5\' 2"  (1.575 m)  Wt 278 lb 6 oz (126.27 kg)  BMI 50.92 kg/m2  SpO2 98%  LMP 12/09/2011  Physical Exam  Nursing note and vitals reviewed. Constitutional: She is oriented to person, place, and time. She appears well-developed and well-nourished.       Obese.    HENT:  Head: Normocephalic and atraumatic.  Right Ear: External ear normal.  Left Ear: External ear normal.  Nose: Nose normal.  Eyes: Conjunctivae and EOM are normal. Right eye exhibits no discharge. Left eye exhibits no discharge.  Neck: Normal range of motion. Neck supple.  Cardiovascular: Normal rate, regular rhythm and normal heart sounds.  Exam reveals no gallop and no friction rub.   No murmur heard. Pulmonary/Chest: Effort normal and breath sounds normal.  Abdominal: Soft.       Decreased bowel sounds.   Neurological: She is alert and oriented to person, place, and time.  Skin: Skin is warm and dry.  Psychiatric: She has a normal mood and affect. Her behavior is normal.    ED Course  Procedures (including critical care time)  DIAGNOSTIC STUDIES: Oxygen Saturation is 98% on room air,  normal by my interpretation.    COORDINATION OF CARE:   Results for orders placed during the hospital encounter of 01/28/12  BASIC METABOLIC PANEL      Component Value Range   Sodium 135  135 - 145 (mEq/L)   Potassium 3.8  3.5 - 5.1 (mEq/L)   Chloride 101  96 - 112 (mEq/L)   CO2 24  19 - 32 (mEq/L)   Glucose, Bld 105 (*) 70 - 99 (mg/dL)   BUN 9  6 - 23 (mg/dL)   Creatinine, Ser 1.61  0.50 - 1.10 (mg/dL)   Calcium 8.8  8.4 - 09.6 (mg/dL)   GFR calc non Af Amer >90  >90 (mL/min)   GFR calc Af Amer >90  >90 (mL/min)  CBC      Component Value Range   WBC 11.0 (*) 4.0 - 10.5 (K/uL)   RBC 4.76  3.87 - 5.11 (MIL/uL)   Hemoglobin 14.4  12.0 - 15.0 (g/dL)   HCT 04.5  40.9 - 81.1 (%)   MCV 88.2  78.0 - 100.0 (fL)     MCH 30.3  26.0 - 34.0 (pg)   MCHC 34.3  30.0 - 36.0 (g/dL)   RDW 91.4  78.2 - 95.6 (%)   Platelets 257  150 - 400 (K/uL)  DIFFERENTIAL      Component Value Range   Neutrophils Relative 79 (*) 43 - 77 (%)   Neutro Abs 8.6 (*) 1.7 - 7.7 (K/uL)   Lymphocytes Relative 10 (*) 12 - 46 (%)   Lymphs Abs 1.1  0.7 - 4.0 (K/uL)   Monocytes Relative 10  3 - 12 (%)   Monocytes Absolute 1.1 (*) 0.1 - 1.0 (K/uL)   Eosinophils Relative 1  0 - 5 (%)   Eosinophils Absolute 0.1  0.0 - 0.7 (K/uL)   Basophils Relative 1  0 - 1 (%)   Basophils Absolute 0.1  0.0 - 0.1 (K/uL)  PREGNANCY, URINE      Component Value Range   Preg Test, Ur NEGATIVE  NEGATIVE   URINALYSIS, ROUTINE W REFLEX MICROSCOPIC      Component Value Range   Color, Urine YELLOW  YELLOW    APPearance CLEAR  CLEAR    Specific Gravity, Urine 1.015  1.005 - 1.030    pH 7.0  5.0 - 8.0    Glucose, UA NEGATIVE  NEGATIVE (mg/dL)   Hgb urine dipstick TRACE (*) NEGATIVE    Bilirubin Urine NEGATIVE  NEGATIVE    Ketones, ur NEGATIVE  NEGATIVE (mg/dL)   Protein, ur NEGATIVE  NEGATIVE (mg/dL)   Urobilinogen, UA 0.2  0.0 - 1.0 (mg/dL)   Nitrite NEGATIVE  NEGATIVE    Leukocytes, UA NEGATIVE  NEGATIVE   URINE MICROSCOPIC-ADD ON      Component Value Range   Squamous Epithelial / LPF MANY (*) RARE    RBC / HPF 0-2  <3 (RBC/hpf)   Bacteria, UA FEW (*) RARE     She feels much better after IV fluids, IV metoclopramide, and IV diphenhydramine. She'll be sent home with a prescription for metoclopramide.  1. Nausea and vomiting   2. Fever   3. Headache      10:14PM- EDP at bedside discusses treatment plan.   MDM  Vomiting and fever which most likely represent viral gastritis. She'll be given a trial of IV fluids and IV antiemetics.     I personally performed the services described in this documentation, which was scribed in my presence. The recorded  information has been reviewed and considered.       Dione Booze, MD 01/28/12 1137

## 2012-01-28 NOTE — Discharge Instructions (Signed)
Nausea and Vomiting Nausea is a sick feeling that often comes before throwing up (vomiting). Vomiting is a reflex where stomach contents come out of your mouth. Vomiting can cause severe loss of body fluids (dehydration). Children and elderly adults can become dehydrated quickly, especially if they also have diarrhea. Nausea and vomiting are symptoms of a condition or disease. It is important to find the cause of your symptoms. CAUSES   Direct irritation of the stomach lining. This irritation can result from increased acid production (gastroesophageal reflux disease), infection, food poisoning, taking certain medicines (such as nonsteroidal anti-inflammatory drugs), alcohol use, or tobacco use.   Signals from the brain.These signals could be caused by a headache, heat exposure, an inner ear disturbance, increased pressure in the brain from injury, infection, a tumor, or a concussion, pain, emotional stimulus, or metabolic problems.   An obstruction in the gastrointestinal tract (bowel obstruction).   Illnesses such as diabetes, hepatitis, gallbladder problems, appendicitis, kidney problems, cancer, sepsis, atypical symptoms of a heart attack, or eating disorders.   Medical treatments such as chemotherapy and radiation.   Receiving medicine that makes you sleep (general anesthetic) during surgery.  DIAGNOSIS Your caregiver may ask for tests to be done if the problems do not improve after a few days. Tests may also be done if symptoms are severe or if the reason for the nausea and vomiting is not clear. Tests may include:  Urine tests.   Blood tests.   Stool tests.   Cultures (to look for evidence of infection).   X-rays or other imaging studies.  Test results can help your caregiver make decisions about treatment or the need for additional tests. TREATMENT You need to stay well hydrated. Drink frequently but in small amounts.You may wish to drink water, sports drinks, clear broth, or  eat frozen ice pops or gelatin dessert to help stay hydrated.When you eat, eating slowly may help prevent nausea.There are also some antinausea medicines that may help prevent nausea. HOME CARE INSTRUCTIONS   Take all medicine as directed by your caregiver.   If you do not have an appetite, do not force yourself to eat. However, you must continue to drink fluids.   If you have an appetite, eat a normal diet unless your caregiver tells you differently.   Eat a variety of complex carbohydrates (rice, wheat, potatoes, bread), lean meats, yogurt, fruits, and vegetables.   Avoid high-fat foods because they are more difficult to digest.   Drink enough water and fluids to keep your urine clear or pale yellow.   If you are dehydrated, ask your caregiver for specific rehydration instructions. Signs of dehydration may include:   Severe thirst.   Dry lips and mouth.   Dizziness.   Dark urine.   Decreasing urine frequency and amount.   Confusion.   Rapid breathing or pulse.  SEEK IMMEDIATE MEDICAL CARE IF:   You have blood or brown flecks (like coffee grounds) in your vomit.   You have black or bloody stools.   You have a severe headache or stiff neck.   You are confused.   You have severe abdominal pain.   You have chest pain or trouble breathing.   You do not urinate at least once every 8 hours.   You develop cold or clammy skin.   You continue to vomit for longer than 24 to 48 hours.   You have a fever.  MAKE SURE YOU:   Understand these instructions.   Will watch your   condition.   Will get help right away if you are not doing well or get worse.  Document Released: 09/16/2005 Document Revised: 09/05/2011 Document Reviewed: 02/13/2011 Exit[employer] Patient Information 2012 [employer]  Metoclopramide tablets What is this medicine? METOCLOPRAMIDE (met oh kloe PRA mide) is used to treat the symptoms of gastroesophageal reflux disease (GERD) like heartburn. It is  also used to treat people with slow emptying of the stomach and intestinal tract. This medicine may be used for other purposes; ask your health care provider or pharmacist if you have questions. What should I tell my health care provider before I take this medicine? They need to know if you have any of these conditions: -breast cancer -depression -diabetes -heart failure -high blood pressure -kidney disease -liver disease -Parkinson's disease or a movement disorder -pheochromocytoma -seizures -stomach obstruction, bleeding, or perforation -an unusual or allergic reaction to metoclopramide, procainamide, sulfites, other medicines, foods, dyes, or preservatives -pregnant or trying to get pregnant -breast-feeding How should I use this medicine? Take this medicine by mouth with a glass of water. Follow the directions on the prescription label. Take this medicine on an empty stomach, about 30 minutes before eating. Take your doses at regular intervals. Do not take your medicine more often than directed. Do not stop taking except on the advice of your doctor or health care professional. A special MedGuide will be given to you by the pharmacist with each prescription and refill. Be sure to read this information carefully each time. Talk to your pediatrician regarding the use of this medicine in children. Special care may be needed. Overdosage: If you think you have taken too much of this medicine contact a poison control center or emergency room at once. NOTE: This medicine is only for you. Do not share this medicine with others. What if I miss a dose? If you miss a dose, take it as soon as you can. If it is almost time for your next dose, take only that dose. Do not take double or extra doses. What may interact with this medicine? -acetaminophen -cyclosporine -digoxin -medicines for blood pressure -medicines for diabetes, including insulin -medicines for hay fever and other  allergies -medicines for depression, especially an Monoamine Oxidase Inhibitor (MAOI) -medicines for Parkinson's disease, like levodopa -medicines for sleep or for pain -tetracycline This list may not describe all possible interactions. Give your health care provider a list of all the medicines, herbs, non-prescription drugs, or dietary supplements you use. Also tell them if you smoke, drink alcohol, or use illegal drugs. Some items may interact with your medicine. What should I watch for while using this medicine? It may take a few weeks for your stomach condition to start to get better. However, do not take this medicine for longer than 12 weeks. The longer you take this medicine, and the more you take it, the greater your chances are of developing serious side effects. If you are an elderly patient, a female patient, or you have diabetes, you may be at an increased risk for side effects from this medicine. Contact your doctor immediately if you start having movements you cannot control such as lip smacking, rapid movements of the tongue, involuntary or uncontrollable movements of the eyes, head, arms and legs, or muscle twitches and spasms. Patients and their families should watch out for worsening depression or thoughts of suicide. Also watch out for any sudden or severe changes in feelings such as feeling anxious, agitated, panicky, irritable, hostile, aggressive, impulsive, severely restless, overly   excited and hyperactive, or not being able to sleep. If this happens, especially at the beginning of treatment or after a change in dose, call your doctor. Do not treat yourself for high fever. Ask your doctor or health care professional for advice. You may get drowsy or dizzy. Do not drive, use machinery, or do anything that needs mental alertness until you know how this drug affects you. Do not stand or sit up quickly, especially if you are an older patient. This reduces the risk of dizzy or fainting  spells. Alcohol can make you more drowsy and dizzy. Avoid alcoholic drinks. What side effects may I notice from receiving this medicine? Side effects that you should report to your doctor or health care professional as soon as possible: -allergic reactions like skin rash, itching or hives, swelling of the face, lips, or tongue -abnormal production of milk in females -breast enlargement in both males and females -change in the way you walk -difficulty moving, speaking or swallowing -drooling, lip smacking, or rapid movements of the tongue -excessive sweating -fever -involuntary or uncontrollable movements of the eyes, head, arms and legs -irregular heartbeat or palpitations -muscle twitches and spasms -unusually weak or tired Side effects that usually do not require medical attention (report to your doctor or health care professional if they continue or are bothersome): -change in sex drive or performance -depressed mood -diarrhea -difficulty sleeping -headache -menstrual changes -restless or nervous This list may not describe all possible side effects. Call your doctor for medical advice about side effects. You may report side effects to FDA at 1-800-FDA-1088. Where should I keep my medicine? Keep out of the reach of children. Store at room temperature between 20 and 25 degrees C (68 and 77 degrees F). Protect from light. Keep container tightly closed. Throw away any unused medicine after the expiration date. NOTE: This sheet is a summary. It may not cover all possible information. If you have questions about this medicine, talk to your doctor, pharmacist, or health care provider.  2012, [employer]. (05/11/2008 4:30:05 PM) 

## 2012-01-28 NOTE — ED Notes (Signed)
N/v/f since yesterday

## 2012-03-02 ENCOUNTER — Encounter (HOSPITAL_COMMUNITY): Payer: Self-pay | Admitting: *Deleted

## 2012-03-02 ENCOUNTER — Emergency Department (HOSPITAL_COMMUNITY)
Admission: EM | Admit: 2012-03-02 | Discharge: 2012-03-02 | Disposition: A | Discharge status: Home / Self Care | Payer: Self-pay | Attending: Emergency Medicine | Admitting: Emergency Medicine

## 2012-03-02 ENCOUNTER — Emergency Department (HOSPITAL_COMMUNITY): Payer: Self-pay

## 2012-03-02 DIAGNOSIS — M25519 Pain in unspecified shoulder: Secondary | ICD-10-CM | POA: Insufficient documentation

## 2012-03-02 DIAGNOSIS — R079 Chest pain, unspecified: Secondary | ICD-10-CM | POA: Insufficient documentation

## 2012-03-02 MED ORDER — TRAMADOL HCL 50 MG PO TABS: 50.0000 mg | ORAL_TABLET | Freq: Four times a day (QID) | ORAL | Status: AC | PRN

## 2012-03-02 MED ORDER — KETOROLAC TROMETHAMINE 60 MG/2ML IM SOLN
60.0000 mg | Freq: Once | INTRAMUSCULAR | Status: AC
  Administered 2012-03-02: 60 mg via INTRAMUSCULAR
  Filled 2012-03-02: qty 2

## 2012-03-02 NOTE — ED Provider Notes (Signed)
History   This chart was scribed for Brandy Guppy, MD by Clarita Crane. The patient was seen in room APA02/APA02. Patient's care was started at 1057.    CSN: [id number]  Arrival date & time 03/02/12  1057   First MD Initiated Contact with Patient 03/02/12 1234      Chief Complaint  Patient presents with  . Chest Pain    (Consider location/radiation/quality/duration/timing/severity/associated sxs/prior treatment) HPI Brandy Hoover is a 31 y.o. female who presents to the Emergency Department complaining of intermittent moderate left sided chest pain radiating to left shoulder and back onset 1.5 weeks ago but worse today with associated nausea and vomiting which began this morning. States chest pain is aggravated with nothing and relieved by nothing. States pain not relieved with use of Tylenol and Motrin. Denies fall, fever, chills, SOB, HA, weakness, numbness, tingling. Patient with h/o panic attacks and cholecystectomy.   Past Medical History  Diagnosis Date  . Panic attacks     Past Surgical History  Procedure Date  . Cholecystectomy     Family History  Problem Relation Age of Onset  . Diabetes Mother   . Hypertension Father   . Diabetes Father     History  Substance Use Topics  . Smoking status: Never Smoker   . Smokeless tobacco: Not on file  . Alcohol Use: No    OB History    Grav Para Term Preterm Abortions TAB SAB Ect Mult Living                  Review of Systems A complete 10 system review of systems was obtained and all systems are negative except as noted in the HPI and PMH.   Allergies  Zithromax  Home Medications   Current Outpatient Rx  Name Route Sig Dispense Refill  . ACETAMINOPHEN 500 MG PO TABS Oral Take 1,000 mg by mouth every 6 (six) hours as needed. For headache pain    . METOCLOPRAMIDE HCL 10 MG PO TABS Oral Take 1 tablet (10 mg total) by mouth every 6 (six) hours as needed (nausea). 30 tablet 0    BP 110/76  Pulse 81  Temp(Src)  98.5 F (36.9 C) (Oral)  Resp 17  Ht 5\' 2"  (1.575 m)  Wt 274 lb (124.286 kg)  BMI 50.12 kg/m2  SpO2 98%  LMP 02/12/2012  Physical Exam  Nursing note and vitals reviewed. Constitutional: She is oriented to person, place, and time. She appears well-developed and well-nourished. No distress.  HENT:  Head: Normocephalic and atraumatic.  Eyes: EOM are normal. Pupils are equal, round, and reactive to light.  Neck: Neck supple. No tracheal deviation present.  Cardiovascular: Normal rate, regular rhythm and intact distal pulses.   No murmur heard. Pulmonary/Chest: Effort normal and breath sounds normal. No respiratory distress. She has no wheezes. She has no rales. She exhibits no tenderness.  Abdominal: Soft. She exhibits no distension.  Musculoskeletal: Normal range of motion. She exhibits no edema and no tenderness.       Bilateral calves non-tender.   Neurological: She is alert and oriented to person, place, and time. No sensory deficit.  Skin: Skin is warm and dry.  Psychiatric: She has a normal mood and affect. Her behavior is normal.    ED Course  Procedures (including critical care time) Morbidly obese. C/o left upper lat chest pain around axilla into left arm. Worse with movement.  No trauma. No weakness. No resp sxs. No pain or swelling in legs.  No risk factors for pe.  pe nl except morbid obesity. Will get cxr.  No blood tests indicated  DIAGNOSTIC STUDIES: Oxygen Saturation is 99% on room air, normal by my interpretation.    COORDINATION OF CARE: 12:51PM-Patient informed of current plan for treatment and evaluation and agrees with plan at this time.    Date: 03/02/2012  Rate: 81  Rhythm: normal sinus rhythm  QRS Axis: normal  Intervals: normal  ST/T Wave abnormalities: normal  Conduction Disutrbances: none  Narrative Interpretation: unremarkable     Labs Reviewed - No data to display No results found.   No diagnosis found.    MDM  Chest  pain      I personally performed the services described in this documentation, which was scribed in my presence. The recorded information has been reviewed and considered.    Brandy Guppy, MD 03/02/12 [phone]

## 2012-03-02 NOTE — ED Notes (Signed)
Intermittent chest pain for over 1 year, across chest then into back.  N/v this am with the chest pain, [facility] and told to take motrin or tylenol. Normal EKG at [facility]  In April.

## 2012-03-02 NOTE — Discharge Instructions (Signed)
Your EKG, and chest x-ray, do not show any significant diseases.  Use ibuprofen 600 mg every 6 hours for pain.  Use Ultram for more severe pain.  Followup with your Dr. if your symptoms persist

## 2012-03-16 ENCOUNTER — Emergency Department (HOSPITAL_COMMUNITY)
Admission: EM | Admit: 2012-03-16 | Discharge: 2012-03-16 | Disposition: A | Discharge status: Home / Self Care | Payer: Self-pay | Attending: Emergency Medicine | Admitting: Emergency Medicine

## 2012-03-16 ENCOUNTER — Encounter (HOSPITAL_COMMUNITY): Payer: Self-pay | Admitting: *Deleted

## 2012-03-16 ENCOUNTER — Emergency Department (HOSPITAL_COMMUNITY): Payer: Self-pay

## 2012-03-16 DIAGNOSIS — R079 Chest pain, unspecified: Secondary | ICD-10-CM | POA: Insufficient documentation

## 2012-03-16 LAB — DIFFERENTIAL
Basophils Relative: 0 % (ref 0–1)
Lymphocytes Relative: 22 % (ref 12–46)
Lymphs Abs: 3 10*3/uL (ref 0.7–4.0)
Monocytes Absolute: 0.6 10*3/uL (ref 0.1–1.0)
Monocytes Relative: 4 % (ref 3–12)
Neutro Abs: 9.6 10*3/uL — ABNORMAL HIGH (ref 1.7–7.7)
Neutrophils Relative %: 72 % (ref 43–77)

## 2012-03-16 LAB — CBC
HCT: 42.5 % (ref 36.0–46.0)
Hemoglobin: 14.2 g/dL (ref 12.0–15.0)
RBC: 4.79 MIL/uL (ref 3.87–5.11)
WBC: 13.5 10*3/uL — ABNORMAL HIGH (ref 4.0–10.5)

## 2012-03-16 LAB — BASIC METABOLIC PANEL
BUN: 12 mg/dL (ref 6–23)
CO2: 26 mEq/L (ref 19–32)
Chloride: 101 mEq/L (ref 96–112)
GFR calc Af Amer: >90 mL/min (ref >90)
Glucose, Bld: 84 mg/dL (ref 70–99)
Potassium: 4.4 mEq/L (ref 3.5–5.1)

## 2012-03-16 LAB — TROPONIN I: Troponin I: <0.3 ng/mL (ref <0.30)

## 2012-03-16 MED ORDER — NAPROXEN 375 MG PO TABS: 375.0000 mg | ORAL_TABLET | Freq: Two times a day (BID) | ORAL | Status: DC

## 2012-03-16 MED ORDER — PREDNISONE 50 MG PO TABS: 50.0000 mg | ORAL_TABLET | Freq: Every day | ORAL | Status: AC

## 2012-03-16 MED ORDER — KETOROLAC TROMETHAMINE 60 MG/2ML IM SOLN
60.0000 mg | Freq: Once | INTRAMUSCULAR | Status: AC
  Administered 2012-03-16: 60 mg via INTRAMUSCULAR
  Filled 2012-03-16: qty 2

## 2012-03-16 MED ORDER — CYCLOBENZAPRINE HCL 10 MG PO TABS: 10.0000 mg | ORAL_TABLET | Freq: Two times a day (BID) | ORAL | Status: AC | PRN

## 2012-03-16 NOTE — ED Notes (Signed)
Pt with CP under left breast that wraps around to back, ongoing for months; also with place to right upper arm that has not healed for awhile

## 2012-03-16 NOTE — Discharge Instructions (Signed)
Tests were all normal. Medication for pain, muscle spasm, inflammation. Try toRESOURCE GUIDE  Chronic Pain Problems: Contact [facility]  [phone] Patients need to be referred by their primary care doctor.  Insufficient Money for Medicine: Contact United Way:  call "211" or [facility] [phone].  No Primary Care Doctor: - Call [facility]  [phone] - can help you locate a primary care doctor that  accepts your insurance, provides certain services, etc. - Physician Referral Service- [phone]  Agencies that provide inexpensive medical care: - [facility]  [phone] - [facility]  [phone] - [facility]  [phone] - Women's Clinic  [phone] - Planned Parenthood  [phone] [facility]  [phone]  Medicaid-accepting [facility] Providers: - [facility]- [address]  [phone], Mon-Fri 9am-7pm, Sat 9am-1pm - [facility]- [address] [place], [address] [place]  [phone] - [facility]- [address], [address] [place]  [phone] [facility]- [address]  [phone] - Renaye Rakers- [address] [place], [address], [phone]  Only accepts [place] patients after they have their name  applied to their card  Self Pay (no insurance) in [place]: - Sickle Cell Patients: Dr Willey Blade, [facility]  [address] [place], [phone] - [facility]- [address] [place]  [phone]       [facility] [place]- [address]       -     [facility]- see information above (Speak to [employer] if you do not have insurance)       -  [facility]- [address] [place], [phone]       -  [facility]- [address],  [phone]       -  [facility]- [address], [phone]       -  Dr Julio Sicks-  [address], [place], [phone]       -  [facility]- [address], [phone]       -  [facility]- [address], [phone], also [address] [phone]       -    [facility]- [address] [place], [phone], 1st & 3rd Saturday   every month, 10am-1pm  1) Find a Doctor and Pay Out of Pocket Although you won't have to find out who is covered by your insurance plan, it is a good idea to ask around and get recommendations. You will then need to call the office and see if the doctor you have chosen will accept you as a new patient and what types of options they offer for patients who are self-pay. Some doctors offer discounts or will set up payment plans for their patients who do not have insurance, but you will need to ask so you aren't surprised when you get to your appointment.  2) Contact Your Local Health Department Not all health departments have doctors that can see patients for sick visits, but many do, so it is worth a call to see if yours does. If you don't know where your local health department is, you can check in your phone book. The CDC also has a tool to help you  locate your state's health department, and many state websites also have listings of all of their local health departments.  3) Find a Walk-in Clinic If your illness is not likely to be very severe or complicated, you may want to try a walk in clinic. These are popping up all over the country in pharmacies, drugstores, and shopping centers. They're usually staffed by nurse practitioners or physician assistants that have been trained to treat common illnesses and complaints. They're usually fairly quick and inexpensive. However, if you have serious medical issues or chronic medical problems, these are probably not your best option  STD Testing - [facility] Department of [facility] [employer], STD Clinic, [address], [place], phone [phone] or  [phone].  Monday - Friday, call for an appointment. [facility] Department of [employer], STD Clinic, [address], [place], phone [phone] or [phone].  Monday - Friday, call for an appointment.  Abuse/Neglect: [facility] Child Abuse Hotline (317) 804-7456 [facility] Child Abuse Hotline 4148581267 (After Hours)  Emergency Shelter:  [facility] 320-725-5821  Maternity Homes: - [facility] at [facility] [place] [facility] [phone] - [facility] ([phone]  MRSA Hotline #:   512-431-0436  [facility] Resources  [facility]  [employer] [facility] Dept. [address]                 [address]         [address] [place] [address]  Blondell Reveal Phone:  [phone]                                  Phone:  [phone]                   Phone:  [phone]  [facility], [phone] - [phone]       -     [facility] in [place], [address],                                  5513296469, [facility] Child Abuse Hotline 319-792-6963 or (670)053-8625 (After Hours)   Behavioral Health Services  Substance Abuse Resources: - [facility] Drug Services  (613) [phone] - [facility] [phone] - [facility] [place] [phone] Floydene Flock [phone] - Residential & Outpatient Substance Abuse Program  [phone]  Psychological Services: [facility]  [phone] Services  [phone] - [facility], [phone] [place]. [address] [place], ACCESS LINE: [phone] or [phone], [employer]  Dental Assistance  If unable to pay or uninsured, contact:  [facility] or  [facility]. to become qualified for the adult dental clinic.  Patients with Medicaid: [facility] [phone] [address]  Joellyn Quails, [phone] [address], [phone]  If unable to pay, or uninsured, contact [facility] [phone]) or [facility] Department [phone] in [place], [phone] in [facility]) to become qualified for the adult dental clinic  Other Low-Cost Community Dental Services: - [facility]- [address] [place], [place], [place], [address], [phone], [address], 2nd and 4th Thursday of the month at 6:30am.  10 clients each day by appointment, can sometimes see walk-in patients if someone does not show for an appointment. [facility]- [address] [place], [place], [address], [phone] - [facility]- [address] [place], [place], [address], [phone] [facility] Health Department- [phone] [facility] Health Department- [phone] [facility] [phone]  find a primary care doctor for followup.

## 2012-03-16 NOTE — ED Provider Notes (Addendum)
History   This chart was scribed for Donnetta Hutching, MD by Toya Smothers. The patient was seen in room APA07/APA07. Patient's care was started at 1346.  CSN: [id number]  Arrival date & time 03/16/12  1346   First MD Initiated Contact with Patient 03/16/12 1507     Chief Complaint  Patient presents with  . Chest Pain   HPI  Brandy Hoover is a 31 y.o. female who presents to the Emergency Department complaining of gradual onset moderate severe stabbing chest pain underneath her L breast radiating to the inferior L back, that started a few months ago. Pt states that th pain has been gradually worsening in severity and frequency since it originally began, and that this morning the pain increased w/o aggravation and she felt nauseous, SOB, and began to sweat. She has been evaluated at the emergency department at least 4 time in 2013 for the same problem, and also seen at the Health department where she was told that she had chest wall spasms. Pt has been taking Ibuprofen without improvement, and said that treatments given after evaluation at the ED have tired her out and not helped. 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    Past Medical History  Diagnosis Date  . Panic attacks     Past Surgical History  Procedure Date  . Cholecystectomy     Family History  Problem Relation Age of Onset  . Diabetes Mother   . Hypertension Father   . Diabetes Father     History  Substance Use Topics  . Smoking status: Never Smoker   . Smokeless tobacco: Not on file  . Alcohol Use: No   Review of Systems  Constitutional: Negative for fever.       Increased Perspiration.  HENT: Negative for rhinorrhea.   Eyes: Negative for pain.  Respiratory: Positive for shortness of breath. Negative for cough.   Cardiovascular: Positive for chest pain.  Gastrointestinal: Positive for nausea. Negative for vomiting, abdominal pain and diarrhea.  Genitourinary:  Negative for dysuria.  Musculoskeletal: Negative for back pain.  Skin: Negative for rash.  Neurological: Negative for weakness and headaches.    Allergies  Zithromax  Home Medications   Current Outpatient Rx  Name Route Sig Dispense Refill  . ACETAMINOPHEN 500 MG PO TABS Oral Take 1,000 mg by mouth every 6 (six) hours as needed. For headache pain    . TRAMADOL HCL 50 MG PO TABS Oral Take 50 mg by mouth every 6 (six) hours as needed. For pain    . METOCLOPRAMIDE HCL 10 MG PO TABS Oral Take 1 tablet (10 mg total) by mouth every 6 (six) hours as needed (nausea). 30 tablet 0    BP 140/99  Pulse 77  Temp 98.8 F (37.1 C) (Oral)  Resp 20  Ht 5\' 2"  (1.575 m)  Wt 270 lb (122.471 kg)  BMI 49.38 kg/m2  SpO2 100%  LMP 02/12/2012  Physical Exam  Nursing note and vitals reviewed. Constitutional: She is oriented to person, place, and time. She appears well-developed and well-nourished. No distress.  HENT:  Head: Normocephalic and atraumatic.  Eyes: EOM are normal. Pupils are equal, round, and reactive to light.  Neck: Neck supple. No tracheal deviation present.  Cardiovascular: Normal rate.   Pulmonary/Chest: Effort normal. No respiratory distress.       Tender to palpation Inferior L breast.  Abdominal: Soft. She exhibits no distension.  Musculoskeletal: Normal range of motion. She exhibits no edema.  Neurological: She is alert and oriented to person, place, and time. No sensory deficit.  Skin: Skin is warm and dry.  Psychiatric: She has a normal mood and affect. Her behavior is normal.    ED Course  Procedures (including critical care time) DIAGNOSTIC STUDIES: Oxygen Saturation is 100% on room air, normal by my interpretation.    COORDINATION OF CARE: 1549- Ordered blood work and chest x-ray.  Labs Reviewed  CBC - Abnormal; Notable for the following:    WBC 13.5 (*)     All other components within normal limits  DIFFERENTIAL - Abnormal; Notable for the following:     Neutro Abs 9.6 (*)     All other components within normal limits  TROPONIN I  BASIC METABOLIC PANEL  POCT PREGNANCY, URINE  PREGNANCY, URINE   Dg Chest 2 View  03/16/2012  *RADIOLOGY REPORT*  Clinical Data: Left-sided chest pain.  CHEST - 2 VIEW  Comparison: 03/02/2012  Findings: Low lung volumes. Heart and mediastinal contours are within normal limits.  No focal opacities or effusions.  No acute bony abnormality.  IMPRESSION: No active cardiopulmonary disease.  Original Report Authenticated By: Cyndie Chime, M.D.    Date: 05/06/2012  Rate: 84  Rhythm: normal sinus rhythm  QRS Axis: normal  Intervals: normal  ST/T Wave abnormalities: normal  Conduction Disutrbances: none  Narrative Interpretation: unremarkable    No diagnosis found.    MDM  Patient at very low risk for cardiac disease and pulmonary embolus. Pain is  off and on for 6 months. Vital signs are normal. Screening tests are negative    I personally performed the services described in this documentation, which was scribed in my presence. The recorded information has been reviewed and considered.         Donnetta Hutching, MD 03/16/12 1846  Donnetta Hutching, MD 05/06/12 1540

## 2012-03-16 NOTE — ED Notes (Signed)
Intermittent chest pain that occurs in various parts of chest for 6 mos.  Has been seen here for same and pt says no cause found. Has appt with Health dept 6/18. Headache also

## 2012-04-02 ENCOUNTER — Emergency Department (HOSPITAL_COMMUNITY)
Admission: EM | Admit: 2012-04-02 | Discharge: 2012-04-03 | Disposition: A | Discharge status: Home / Self Care | Payer: Self-pay | Attending: Emergency Medicine | Admitting: Emergency Medicine

## 2012-04-02 ENCOUNTER — Encounter (HOSPITAL_COMMUNITY): Payer: Self-pay | Admitting: Emergency Medicine

## 2012-04-02 ENCOUNTER — Emergency Department (HOSPITAL_COMMUNITY): Payer: Self-pay

## 2012-04-02 DIAGNOSIS — R1032 Left lower quadrant pain: Secondary | ICD-10-CM | POA: Insufficient documentation

## 2012-04-02 DIAGNOSIS — N946 Dysmenorrhea, unspecified: Secondary | ICD-10-CM | POA: Insufficient documentation

## 2012-04-02 LAB — CBC WITH DIFFERENTIAL/PLATELET
Basophils Absolute: 0.1 10*3/uL (ref 0.0–0.1)
HCT: 38.6 % (ref 36.0–46.0)
Lymphocytes Relative: 26 % (ref 12–46)
Neutro Abs: 8.1 10*3/uL — ABNORMAL HIGH (ref 1.7–7.7)
Platelets: 286 10*3/uL (ref 150–400)
RDW: 14.1 % (ref 11.5–15.5)
WBC: 12.7 10*3/uL — ABNORMAL HIGH (ref 4.0–10.5)

## 2012-04-02 LAB — URINALYSIS, ROUTINE W REFLEX MICROSCOPIC
Specific Gravity, Urine: 1.015 (ref 1.005–1.030)
Urobilinogen, UA: 0.2 mg/dL (ref 0.0–1.0)
pH: 6 (ref 5.0–8.0)

## 2012-04-02 LAB — BASIC METABOLIC PANEL
Calcium: 9.3 mg/dL (ref 8.4–10.5)
Chloride: 104 mEq/L (ref 96–112)
Creatinine, Ser: 0.77 mg/dL (ref 0.50–1.10)
GFR calc Af Amer: >90 mL/min (ref >90)
Sodium: 140 mEq/L (ref 135–145)

## 2012-04-02 LAB — POCT PREGNANCY, URINE: Preg Test, Ur: NEGATIVE

## 2012-04-02 LAB — URINE MICROSCOPIC-ADD ON

## 2012-04-02 MED ORDER — ONDANSETRON HCL 4 MG/2ML IJ SOLN
4.0000 mg | Freq: Once | INTRAMUSCULAR | Status: AC
  Administered 2012-04-02: 4 mg via INTRAVENOUS
  Filled 2012-04-02: qty 2

## 2012-04-02 MED ORDER — SODIUM CHLORIDE 0.9 % IV SOLN
INTRAVENOUS | Status: DC
  Administered 2012-04-02: 1000 mL via INTRAVENOUS

## 2012-04-02 MED ORDER — HYDROMORPHONE HCL PF 1 MG/ML IJ SOLN
1.0000 mg | Freq: Once | INTRAMUSCULAR | Status: AC
  Administered 2012-04-02: 1 mg via INTRAVENOUS
  Filled 2012-04-02: qty 1

## 2012-04-02 MED ORDER — SODIUM CHLORIDE 0.9 % IV BOLUS (SEPSIS)
250.0000 mL | Freq: Once | INTRAVENOUS | Status: AC
  Administered 2012-04-02: 250 mL via INTRAVENOUS

## 2012-04-02 NOTE — ED Notes (Signed)
Patient c/o pain to LLQ pain that started around 7pm; denies N/V/D.  Patient states she did not have a period last month, but started bleeding yesterday with heavy blood clots.

## 2012-04-02 NOTE — ED Provider Notes (Signed)
History  Scribed for Shelda Jakes, MD, the patient was seen in room APA09/APA09. This chart was scribed by Candelaria Stagers. The patient's care started at 10:15 PM   CSN: [id number]  Arrival date & time 04/02/12  2104   First MD Initiated Contact with Patient 04/02/12 2125      Chief Complaint  Patient presents with  . Abdominal Pain  . Vaginal Pain  . Vaginal Bleeding     Patient is a 31 y.o. female presenting with vaginal bleeding. The history is provided by the patient.  Vaginal Bleeding Associated symptoms include abdominal pain.   Brandy Hoover is a 31 y.o. female who presents to the Emergency Department complaining of a constant sharp non radiating LLQ pain that started about three hours ago.  She is also experiencing nausea, vomiting, and diarrhea.  Pt reports that she did not have a period last month and yesterday experienced bleeding with heavy clots today.  She denies any pain yesterday and states that she has never experienced similar sx.  Nothing seems to make the pain better or worse.   Past Medical History  Diagnosis Date  . Panic attacks     Past Surgical History  Procedure Date  . Cholecystectomy     Family History  Problem Relation Age of Onset  . Diabetes Mother   . Hypertension Father   . Diabetes Father     History  Substance Use Topics  . Smoking status: Never Smoker   . Smokeless tobacco: Not on file  . Alcohol Use: No    OB History    Grav Para Term Preterm Abortions TAB SAB Ect Mult Living                  Review of Systems  Constitutional: Negative for fever.  HENT: Negative for congestion and sore throat.   Eyes: Negative for visual disturbance.  Gastrointestinal: Positive for abdominal pain. Negative for nausea, vomiting and diarrhea.  Genitourinary: Positive for vaginal bleeding.  Skin: Negative for rash.    Allergies  Zithromax  Home Medications   Current Outpatient Rx  Name Route Sig Dispense Refill  . ACETAMINOPHEN  500 MG PO TABS Oral Take 1,000 mg by mouth every 6 (six) hours as needed. For headache pain    . ASPIRIN 325 MG PO TABS Oral Take 325 mg by mouth as needed.    . ADULT MULTIVITAMIN W/MINERALS CH Oral Take 1 tablet by mouth daily.      BP 117/62  Pulse 85  Temp 98.6 F (37 C) (Oral)  Resp 20  Ht 5\' 2"  (1.575 m)  Wt 270 lb (122.471 kg)  BMI 49.38 kg/m2  SpO2 100%  LMP 04/01/2012  Physical Exam  Nursing note and vitals reviewed. Constitutional: She is oriented to person, place, and time. She appears well-developed and well-nourished.  HENT:  Head: Normocephalic and atraumatic.  Eyes: EOM are normal.  Neck: Normal range of motion.  Cardiovascular: Normal rate and regular rhythm.   No murmur heard. Pulmonary/Chest: Breath sounds normal. She has no wheezes. She has no rales.  Abdominal: Bowel sounds are normal. There is tenderness.       UQ non tender, LLQ non tender, RLQ tender.   Genitourinary:       Vaginal bleeding. No significant uterine tenderness.  Musculoskeletal: Normal range of motion. She exhibits no edema and no tenderness.  Neurological: She is alert and oriented to person, place, and time.  Skin: Skin is warm and dry.  Psychiatric: She has a normal mood and affect. Her behavior is normal.    ED Course  Procedures   DIAGNOSTIC STUDIES: Oxygen Saturation is 100% on room air, normal by my interpretation.    COORDINATION OF CARE:     Results for orders placed during the hospital encounter of 04/02/12  URINALYSIS, ROUTINE W REFLEX MICROSCOPIC      Component Value Range   Color, Urine YELLOW  YELLOW   APPearance CLOUDY (*) CLEAR   Specific Gravity, Urine 1.015  1.005 - 1.030   pH 6.0  5.0 - 8.0   Glucose, UA NEGATIVE  NEGATIVE mg/dL   Hgb urine dipstick LARGE (*) NEGATIVE   Bilirubin Urine NEGATIVE  NEGATIVE   Ketones, ur NEGATIVE  NEGATIVE mg/dL   Protein, ur NEGATIVE  NEGATIVE mg/dL   Urobilinogen, UA 0.2  0.0 - 1.0 mg/dL   Nitrite NEGATIVE  NEGATIVE    Leukocytes, UA NEGATIVE  NEGATIVE  POCT PREGNANCY, URINE      Component Value Range   Preg Test, Ur NEGATIVE  NEGATIVE  URINE MICROSCOPIC-ADD ON      Component Value Range   RBC / HPF TOO NUMEROUS TO COUNT  <3 RBC/hpf    No results found.   No diagnosis found.    MDM  Suspect dysfunctional uterine bleeding patient is not pregnant CT scan pending to further evaluate the left lower corner abdominal pain patient does not have any vaginal discharge other than the vaginal bleeding. If CT scan is negative patient can be discharged home with followup with OB/GYN referral to [facility] has been made. Patient's pain improved in the emergency department.     I personally performed the services described in this documentation, which was scribed in my presence. The recorded information has been reviewed and considered.         Shelda Jakes, MD 04/03/12 [phone]

## 2012-04-03 MED ORDER — IOHEXOL 300 MG/ML  SOLN
100.0000 mL | Freq: Once | INTRAMUSCULAR | Status: AC | PRN
  Administered 2012-04-03: 100 mL via INTRAVENOUS

## 2012-04-03 MED ORDER — HYDROCODONE-ACETAMINOPHEN 5-325 MG PO TABS: 1.0000 | ORAL_TABLET | Freq: Four times a day (QID) | ORAL | Status: AC | PRN

## 2012-04-03 MED ORDER — IOHEXOL 300 MG/ML  SOLN: 40.0000 mL | Freq: Once | INTRAMUSCULAR | Status: DC | PRN

## 2012-04-03 NOTE — ED Notes (Signed)
Pt alert & oriented x4, stable gait. Pt given discharge instructions, paperwork & prescription(s). Patient instructed to stop at the registration desk to finish any additional paperwork. pt verbalized understanding. Pt left department w/ no further questions.  

## 2012-04-03 NOTE — ED Provider Notes (Signed)
0150 Assumed care, disposition of patient who presented with lower abdominal pain associated with dysmenorrhea. Awaiting CT abd/pelvis results. VSS. CT is negative for acute process. Dx testing d/w pt and husband .  Questions answered.  Verb understanding, agreeable to d/c home with outpt f/u.Referral to OB/GYN already made. Pt stable in ED with no significant deterioration in condition.The patient appears reasonably screened and/or stabilized for discharge and I doubt any other medical condition or other [facility] requiring further screening, evaluation, or treatment in the ED at this time prior to discharge.  Ct Abdomen Pelvis W Contrast  04/03/2012  *RADIOLOGY REPORT*  Clinical Data: Abdominal pain, vaginal pain and bleeding.  CT ABDOMEN AND PELVIS WITH CONTRAST  Technique:  Multidetector CT imaging of the abdomen and pelvis was performed following the standard protocol during bolus administration of intravenous contrast.  Contrast: OMNIPAQUE IOHEXOL 300 MG/ML  SOLN  Comparison: 04/27/2004  Findings: Mild lung base opacities.  Low attenuation of the liver is in keeping with fatty infiltration. Absent gallbladder.  No biliary ductal dilatation.  Unremarkable spleen, pancreas, adrenal glands.  Symmetric renal enhancement.  No hydronephrosis or hydroureter.  No bowel obstruction.  No CT evidence for colitis.  Normal appendix.  No free intraperitoneal air or fluid.  No lymphadenopathy.  Normal caliber vasculature.  Nonspecific appearance to the uterus.  No adnexal mass.  No acute osseous finding.  Multilevel degenerative change.  IMPRESSION: No acute abnormality identified by CT.  Nonspecific appearance to the endometrium, may be within normal limits depending on the menstrual cycle phase. Given the stated history, recommend ultrasound follow-up.  Original Report Authenticated By: Waneta Martins, M.D.      Nicoletta Dress. Colon Branch, MD 04/03/12 [phone]

## 2012-06-17 ENCOUNTER — Other Ambulatory Visit (HOSPITAL_COMMUNITY): Payer: Self-pay | Admitting: *Deleted

## 2012-06-17 ENCOUNTER — Ambulatory Visit (HOSPITAL_COMMUNITY)
Admission: RE | Admit: 2012-06-17 | Discharge: 2012-06-17 | Disposition: A | Discharge status: Home / Self Care | Payer: Self-pay | Source: Ambulatory Visit | Attending: *Deleted | Admitting: *Deleted

## 2012-06-17 DIAGNOSIS — M5137 Other intervertebral disc degeneration, lumbosacral region: Secondary | ICD-10-CM | POA: Insufficient documentation

## 2012-06-17 DIAGNOSIS — M545 Low back pain, unspecified: Secondary | ICD-10-CM | POA: Insufficient documentation

## 2012-06-17 DIAGNOSIS — M51379 Other intervertebral disc degeneration, lumbosacral region without mention of lumbar back pain or lower extremity pain: Secondary | ICD-10-CM | POA: Insufficient documentation

## 2012-12-30 ENCOUNTER — Encounter (HOSPITAL_COMMUNITY): Payer: Self-pay | Admitting: Emergency Medicine

## 2012-12-30 ENCOUNTER — Emergency Department (HOSPITAL_COMMUNITY): Payer: Self-pay

## 2012-12-30 ENCOUNTER — Emergency Department (HOSPITAL_COMMUNITY)
Admission: EM | Admit: 2012-12-30 | Discharge: 2012-12-30 | Disposition: A | Discharge status: Home / Self Care | Payer: Self-pay | Attending: Emergency Medicine | Admitting: Emergency Medicine

## 2012-12-30 DIAGNOSIS — R3915 Urgency of urination: Secondary | ICD-10-CM | POA: Insufficient documentation

## 2012-12-30 DIAGNOSIS — N390 Urinary tract infection, site not specified: Secondary | ICD-10-CM | POA: Insufficient documentation

## 2012-12-30 DIAGNOSIS — R350 Frequency of micturition: Secondary | ICD-10-CM | POA: Insufficient documentation

## 2012-12-30 DIAGNOSIS — R110 Nausea: Secondary | ICD-10-CM | POA: Insufficient documentation

## 2012-12-30 DIAGNOSIS — Z3202 Encounter for pregnancy test, result negative: Secondary | ICD-10-CM | POA: Insufficient documentation

## 2012-12-30 DIAGNOSIS — R300 Dysuria: Secondary | ICD-10-CM | POA: Insufficient documentation

## 2012-12-30 DIAGNOSIS — Z8659 Personal history of other mental and behavioral disorders: Secondary | ICD-10-CM | POA: Insufficient documentation

## 2012-12-30 LAB — WET PREP, GENITAL
Trich, Wet Prep: NONE SEEN
Yeast Wet Prep HPF POC: NONE SEEN

## 2012-12-30 LAB — URINALYSIS, ROUTINE W REFLEX MICROSCOPIC
Leukocytes, UA: NEGATIVE
Nitrite: NEGATIVE
Specific Gravity, Urine: <1.005 — ABNORMAL LOW (ref 1.005–1.030)
pH: 6 (ref 5.0–8.0)

## 2012-12-30 LAB — COMPREHENSIVE METABOLIC PANEL
AST: 17 U/L (ref 0–37)
Albumin: 3.4 g/dL — ABNORMAL LOW (ref 3.5–5.2)
BUN: 8 mg/dL (ref 6–23)
Calcium: 8.8 mg/dL (ref 8.4–10.5)
Chloride: 105 mEq/L (ref 96–112)
Creatinine, Ser: 0.62 mg/dL (ref 0.50–1.10)
Total Bilirubin: 1 mg/dL (ref 0.3–1.2)

## 2012-12-30 LAB — CBC WITH DIFFERENTIAL/PLATELET
Basophils Absolute: 0 10*3/uL (ref 0.0–0.1)
HCT: 41.7 % (ref 36.0–46.0)
Lymphocytes Relative: 24 % (ref 12–46)
Lymphs Abs: 2.2 10*3/uL (ref 0.7–4.0)
Monocytes Absolute: 0.6 10*3/uL (ref 0.1–1.0)
Neutro Abs: 6.3 10*3/uL (ref 1.7–7.7)
Platelets: 314 10*3/uL (ref 150–400)
RBC: 4.81 MIL/uL (ref 3.87–5.11)
RDW: 13.9 % (ref 11.5–15.5)
WBC: 9.3 10*3/uL (ref 4.0–10.5)

## 2012-12-30 LAB — RPR: RPR Ser Ql: NONREACTIVE

## 2012-12-30 LAB — LIPASE, BLOOD: Lipase: 16 U/L (ref 11–59)

## 2012-12-30 MED ORDER — HYDROCODONE-ACETAMINOPHEN 5-325 MG PO TABS: 1.0000 | ORAL_TABLET | ORAL | Status: DC | PRN

## 2012-12-30 MED ORDER — IOHEXOL 300 MG/ML  SOLN
50.0000 mL | Freq: Once | INTRAMUSCULAR | Status: AC | PRN
  Administered 2012-12-30: 50 mL via ORAL

## 2012-12-30 MED ORDER — PHENAZOPYRIDINE HCL 200 MG PO TABS: 200.0000 mg | ORAL_TABLET | Freq: Three times a day (TID) | ORAL | Status: DC | PRN

## 2012-12-30 MED ORDER — IOHEXOL 300 MG/ML  SOLN: 50.0000 mL | Freq: Once | INTRAMUSCULAR | Status: DC | PRN

## 2012-12-30 MED ORDER — PHENAZOPYRIDINE HCL 100 MG PO TABS
200.0000 mg | ORAL_TABLET | Freq: Once | ORAL | Status: AC
  Administered 2012-12-30: 200 mg via ORAL
  Filled 2012-12-30: qty 2

## 2012-12-30 MED ORDER — ONDANSETRON 8 MG PO TBDP
8.0000 mg | ORAL_TABLET | Freq: Once | ORAL | Status: AC
  Administered 2012-12-30: 8 mg via ORAL
  Filled 2012-12-30: qty 1

## 2012-12-30 MED ORDER — IOHEXOL 300 MG/ML  SOLN
100.0000 mL | Freq: Once | INTRAMUSCULAR | Status: AC | PRN
  Administered 2012-12-30: 100 mL via INTRAVENOUS

## 2012-12-30 NOTE — ED Notes (Signed)
Pt states flank pain and urinary symptom since yesterday.

## 2012-12-30 NOTE — ED Provider Notes (Signed)
History     CSN: [id number]  Arrival date & time 12/30/12  0911   First MD Initiated Contact with Patient 12/30/12 [phone]      Chief Complaint  Patient presents with  . Flank Pain  . Urinary Tract Infection    (Consider location/radiation/quality/duration/timing/severity/associated sxs/prior treatment) HPI Comments: Brandy Hoover is a 32 y.o. Female presenting with a 24 hour history of bilateral flank pain and pressure along with suprapubic pressure and increased urinary frequency and urgency with small amounts of urine.  She denies hematuria, fevers or chills but has had some mild nausea without emesis.  She denies history of kidney stones.  She has had no changes in bowel habits, appetite has been normal.  She has taken ibuprofen yesterday evening without relief of symptoms, although she states she was able to sleep through the night.  Past medical history is unremarkable.  Surgical history positive for cholecystectomy.     The history is provided by the patient and a relative.    Past Medical History  Diagnosis Date  . Panic attacks     Past Surgical History  Procedure Laterality Date  . Cholecystectomy      Family History  Problem Relation Age of Onset  . Diabetes Mother   . Hypertension Father   . Diabetes Father     History  Substance Use Topics  . Smoking status: Never Smoker   . Smokeless tobacco: Not on file  . Alcohol Use: No    OB History   Grav Para Term Preterm Abortions TAB SAB Ect Mult Living                  Review of Systems  Constitutional: Negative for fever.  HENT: Negative for congestion, sore throat and neck pain.   Eyes: Negative.   Respiratory: Negative for chest tightness and shortness of breath.   Cardiovascular: Negative for chest pain.  Gastrointestinal: Positive for nausea. Negative for vomiting, abdominal pain and diarrhea.  Genitourinary: Positive for dysuria and urgency. Negative for hematuria.  Musculoskeletal: Negative for  joint swelling and arthralgias.  Skin: Negative.  Negative for rash and wound.  Neurological: Negative for dizziness, weakness, light-headedness, numbness and headaches.  Psychiatric/Behavioral: Negative.     Allergies  Zithromax  Home Medications   Current Outpatient Rx  Name  Route  Sig  Dispense  Refill  . ibuprofen (ADVIL,MOTRIN) 200 MG tablet   Oral   Take 600 mg by mouth every 6 (six) hours as needed for pain.         . traMADol (ULTRAM) 50 MG tablet   Oral   Take 50 mg by mouth every 6 (six) hours as needed for pain (for back pain).           LMP 07/01/2012  Physical Exam  Nursing note and vitals reviewed. Constitutional: She appears well-developed and well-nourished.  Obese  HENT:  Head: Normocephalic and atraumatic.  Eyes: Conjunctivae are normal.  Neck: Normal range of motion.  Cardiovascular: Normal rate, regular rhythm, normal heart sounds and intact distal pulses.   Pulmonary/Chest: Effort normal and breath sounds normal. She has no wheezes.  Abdominal: Soft. Bowel sounds are normal. She exhibits no distension and no mass. There is tenderness. There is no rebound and no guarding.  No CVA tenderness.  She has mild discomfort with deep palpation in the suprapubic region.  Musculoskeletal: Normal range of motion.  Neurological: She is alert.  Skin: Skin is warm and dry.  Psychiatric: She  has a normal mood and affect.    ED Course  Procedures (including critical care time)  Labs Reviewed  URINALYSIS, ROUTINE W REFLEX MICROSCOPIC  PREGNANCY, URINE   No results found.   No diagnosis found.  10:18 AM Urinalysis completed and negative for uti.  Re-exam of pt now with tenderness to deep palpation right lower quadrant but also in upper abdominal quadrants.  No guarding.  Difficult exam secondary to adiposity.  She denies vaginal discharge.  Nausea persists despite zofran.  Will complete blood work, pelvic exam.  11:07 AM Pelvic completed.  No adnexal  ttp or uterine ttp,  Pain rlq,  Will Ct to r/o appy.  MDM  Patients labs and/or radiological studies were viewed and considered during the medical decision making and disposition process.  Ct scan negative including normal appendix,  No ureteral or renal stones.  Pt was prescribed pyridium,  Encouraged increased fluid intake (husband states she drinks lots of soda).  Few hydrocodone for additional pain relief.  Recheck by pcp if not improving,  Return here for any worsened sx.  Pt informed urine cx pending.        Burgess Amor, PA-C 12/30/12 1240

## 2012-12-30 NOTE — ED Provider Notes (Signed)
Medical screening examination/treatment/procedure(s) were performed by non-physician practitioner and as supervising physician I was immediately available for consultation/collaboration. Arn Mcomber, MD, FACEP   Ayiden Milliman L Ciin Brazzel, MD 12/30/12 1446 

## 2012-12-31 LAB — GC/CHLAMYDIA PROBE AMP: CT Probe RNA: NEGATIVE

## 2012-12-31 LAB — URINE CULTURE: Colony Count: 50000

## 2013-02-12 ENCOUNTER — Encounter (HOSPITAL_COMMUNITY): Payer: Self-pay | Admitting: Emergency Medicine

## 2013-02-12 ENCOUNTER — Emergency Department (HOSPITAL_COMMUNITY)
Admission: EM | Admit: 2013-02-12 | Discharge: 2013-02-12 | Disposition: A | Discharge status: Home / Self Care | Payer: Self-pay | Attending: Emergency Medicine | Admitting: Emergency Medicine

## 2013-02-12 DIAGNOSIS — N946 Dysmenorrhea, unspecified: Secondary | ICD-10-CM | POA: Insufficient documentation

## 2013-02-12 DIAGNOSIS — Z3202 Encounter for pregnancy test, result negative: Secondary | ICD-10-CM | POA: Insufficient documentation

## 2013-02-12 DIAGNOSIS — R109 Unspecified abdominal pain: Secondary | ICD-10-CM | POA: Insufficient documentation

## 2013-02-12 DIAGNOSIS — N920 Excessive and frequent menstruation with regular cycle: Secondary | ICD-10-CM | POA: Insufficient documentation

## 2013-02-12 DIAGNOSIS — Z9089 Acquired absence of other organs: Secondary | ICD-10-CM | POA: Insufficient documentation

## 2013-02-12 DIAGNOSIS — Z8659 Personal history of other mental and behavioral disorders: Secondary | ICD-10-CM | POA: Insufficient documentation

## 2013-02-12 LAB — WET PREP, GENITAL
Trich, Wet Prep: NONE SEEN
Yeast Wet Prep HPF POC: NONE SEEN

## 2013-02-12 LAB — POCT PREGNANCY, URINE: Preg Test, Ur: NEGATIVE

## 2013-02-12 MED ORDER — OXYCODONE-ACETAMINOPHEN 5-325 MG PO TABS
1.0000 | ORAL_TABLET | Freq: Once | ORAL | Status: AC
  Administered 2013-02-12: 1 via ORAL
  Filled 2013-02-12: qty 1

## 2013-02-12 MED ORDER — ONDANSETRON 8 MG PO TBDP
8.0000 mg | ORAL_TABLET | Freq: Once | ORAL | Status: AC
  Administered 2013-02-12: 8 mg via ORAL
  Filled 2013-02-12: qty 1

## 2013-02-12 MED ORDER — HYDROMORPHONE HCL PF 1 MG/ML IJ SOLN
2.0000 mg | Freq: Once | INTRAMUSCULAR | Status: DC
  Filled 2013-02-12: qty 1

## 2013-02-12 MED ORDER — HYDROMORPHONE HCL 2 MG PO TABS: 2.0000 mg | ORAL_TABLET | ORAL | Status: DC | PRN

## 2013-02-12 NOTE — ED Notes (Signed)
Pt states vaginal bleeding since Wednesday night. Pt states it was time for her period. Pt here for increased bleeding and severe abd pain

## 2013-02-12 NOTE — Discharge Instructions (Signed)
Make an appointment with the health department to see if you can get referred to a gynecologist. You probably need to be on some hormone treatments to find her regulate yor menstrual cycle.  Dysmenorrhea Menstrual pain is caused by the muscles of the uterus tightening (contracting) during a menstrual period. The muscles of the uterus contract due to the chemicals in the uterine lining. Primary dysmenorrhea is menstrual cramps that last a couple of days when you start having menstrual periods or soon after. This often begins after a teenager starts having her period. As a woman gets older or has a baby, the cramps will usually lesson or disappear. Secondary dysmenorrhea begins later in life, lasts longer, and the pain may be stronger than primary dysmenorrhea. The pain may start before the period and last a few days after the period. This type of dysmenorrhea is usually caused by an underlying problem such as:  The tissue lining the uterus grows outside of the uterus in other areas of the body (endometriosis).  The endometrial tissue, which normally lines the uterus, is found in or grows into the muscular walls of the uterus (adenomyosis).  The pelvic blood vessels are engorged with blood just before the menstrual period (pelvic congestive syndrome).  Overgrowth of cells in the lining of the uterus or cervix (polyps of the uterus or cervix).  Falling down of the uterus (prolapse) because of loose or stretched ligaments.  Depression.  Bladder problems, infection, or inflammation.  Problems with the intestine, a tumor, or irritable bowel syndrome.  Cancer of the female organs or bladder.  A severely tipped uterus.  A very tight opening or closed cervix.  Noncancerous tumors of the uterus (fibroids).  Pelvic inflammatory disease (PID).  Pelvic scarring (adhesions) from a previous surgery.  Ovarian cyst.  An intrauterine device (IUD) used for birth control. CAUSES  The cause of  menstrual pain is often unknown. SYMPTOMS   Cramping or throbbing pain in your lower abdomen.  Sometimes, a woman may also experience headaches.  Lower back pain.  Feeling sick to your stomach (nausea) or vomiting.  Diarrhea.  Sweating or dizziness. DIAGNOSIS  A diagnosis is based on your history, symptoms, physical examination, diagnostic tests, or procedures. Diagnostic tests or procedures may include:  Blood tests.  An ultrasound.  An examination of the lining of the uterus (dilation and curettage, D&C).  An examination inside your abdomen or pelvis with a scope (laparoscopy).  X-rays.  CT Scan.  MRI.  An examination inside the bladder with a scope (cystoscopy).  An examination inside the intestine or stomach with a scope (colonoscopy, gastroscopy). TREATMENT  Treatment depends on the cause of the dysmenorrhea. Treatment may include:  Pain medicine prescribed by your caregiver.  Birth control pills.  Hormone replacement therapy.  Nonsteroidal anti-inflammatory drugs (NSAIDs). These may help stop the production of prostaglandins.  An IUD with progesterone hormone in it.  Acupuncture.  Surgery to remove adhesions, endometriosis, ovarian cyst, or fibroids.  Removal of the uterus (hysterectomy).  Progesterone shots to stop the menstrual period.  Cutting the nerves on the sacrum that go to the female organs (presacral neurectomy).  Electric currant to the sacral nerves (sacral nerve stimulation).  Antidepressant medicine.  Psychiatric therapy, counseling, or group therapy.  Exercise and physical therapy.  Meditation and yoga therapy. HOME CARE INSTRUCTIONS   Only take over-the-counter or prescription medicines for pain, discomfort, or fever as directed by your caregiver.  Place a heating pad or hot water bottle on your  lower back or abdomen. Do not sleep with the heating pad.  Use aerobic exercises, walking, swimming, biking, and other exercises  to help lessen the cramping.  Massage to the lower back or abdomen may help.  Stop smoking.  Avoid alcohol and caffeine.  Yoga, meditation, or acupuncture may help. SEEK MEDICAL CARE IF:   The pain does not get better with medicine.  You have pain with sexual intercourse. SEEK IMMEDIATE MEDICAL CARE IF:   Your pain increases and is not controlled with medicines.  You have a fever.  You develop nausea or vomiting with your period not controlled with medicine.  You have abnormal vaginal bleeding with your period.  You pass out. MAKE SURE YOU:   Understand these instructions.  Will watch your condition.  Will get help right away if you are not doing well or get worse. Document Released: 09/16/2005 Document Revised: 12/09/2011 Document Reviewed: 01/02/2009 [facility] Patient Information 2013 [place], [place].  Hydromorphone tablets What is this medicine? HYDROMORPHONE (hye droe MOR fone) is a pain reliever. It is used to treat moderate to severe pain. This medicine may be used for other purposes; ask your health care provider or pharmacist if you have questions. What should I tell my health care provider before I take this medicine? They need to know if you have any of these conditions: -brain tumor -drug abuse or addiction -head injury -heart disease -frequently drink alcohol containing drinks -kidney disease or problems going to the bathroom -liver disease -lung disease, asthma, or breathing problems -mental problems -an allergic or unusual reaction to lactose, hydromorphone, other opioid analgesics, other medicines, sulfites, foods, dyes, or preservatives -pregnant or trying to get pregnant -breast-feeding How should I use this medicine? Take this medicine by mouth with a glass of water. If the medicine upsets your stomach, take it with food or milk. Follow the directions on the prescription label. Do not take more medicine than you are told to take. Talk to your  pediatrician regarding the use of this medicine in children. Special care may be needed. Overdosage: If you think you have taken too much of this medicine contact a poison control center or emergency room at once. NOTE: This medicine is only for you. Do not share this medicine with others. What if I miss a dose? If you miss a dose, take it as soon as you can. If it is almost time for your next dose, take only that dose. Do not take double or extra doses. What may interact with this medicine? -alcohol -antihistamines for allergy, cough and cold -medicines for anesthesia -medicines for depression, anxiety, or psychotic disturbances -medicines for sleep -muscle relaxants -naltrexone -narcotic medicines for pain -phenothiazines like chlorpromazine, mesoridazine, prochlorperazine, thioridazine This list may not describe all possible interactions. Give your health care provider a list of all the medicines, herbs, non-prescription drugs, or dietary supplements you use. Also tell them if you smoke, drink alcohol, or use illegal drugs. Some items may interact with your medicine. What should I watch for while using this medicine? Tell your doctor or health care professional if your pain does not go away, if it gets worse, or if you have new or a different type of pain. You may develop tolerance to the medicine. Tolerance means that you will need a higher dose of the medicine for pain relief. Tolerance is normal and is expected if you take this medicine for a long time. Do not suddenly stop taking your medicine because you may develop a severe  reaction. Your body becomes used to the medicine. This does NOT mean you are addicted. Addiction is a behavior related to getting and using a drug for a non-medical reason. If you have pain, you have a medical reason to take pain medicine. Your doctor will tell you how much medicine to take. If your doctor wants you to stop the medicine, the dose will be slowly lowered  over time to avoid any side effects. You may get drowsy or dizzy. Do not drive, use machinery, or do anything that needs mental alertness until you know how this medicine affects you. Do not stand or sit up quickly, especially if you are an older patient. This reduces the risk of dizzy or fainting spells. Alcohol may interfere with the effect of this medicine. Avoid alcoholic drinks. This medicine will cause constipation. Try to have a bowel movement at least every 2 to 3 days. If you do not have a bowel movement for 3 days, call your doctor or health care professional. Your mouth may get dry. Chewing sugarless gum or sucking hard candy, and drinking plenty of water may help. Contact your doctor if the problem does not go away or is severe. What side effects may I notice from receiving this medicine? Side effects that you should report to your doctor or health care professional as soon as possible: -allergic reactions like skin rash, itching or hives, swelling of the face, lips, or tongue -breathing problems -changes in vision -confusion -feeling faint or lightheaded, falls -seizures -slow or fast heartbeat -trouble passing urine or change in the amount of urine -trouble with balance, talking, walking Side effects that usually do not require medical attention (report to your doctor or health care professional if they continue or are bothersome): -difficulty sleeping -drowsiness -dry mouth -flushing -headache -itching -loss of appetite -nausea, vomiting This list may not describe all possible side effects. Call your doctor for medical advice about side effects. You may report side effects to FDA at 1-800-FDA-1088. Where should I keep my medicine? Keep out of the reach of children. This medicine can be abused. Keep your medicine in a safe place to protect it from theft. Do not share this medicine with anyone. Selling or giving away this medicine is dangerous and against the law. Store at  room temperature between 15 and 30 degrees C (59 and 86 degrees F). Keep container tightly closed. Protect from light. Flush any unused medicines down the toilet. Do not use the medicine after the expiration date. NOTE: This sheet is a summary. It may not cover all possible information. If you have questions about this medicine, talk to your doctor, pharmacist, or health care provider.  2012, [employer]. (08/12/2008 10:24:26 AM)

## 2013-02-12 NOTE — ED Provider Notes (Signed)
History  This chart was scribed for Brandy Booze, MD,  by Concha Se, ED Scribe and Bennett Scrape, ED Scribe. The patient was seen in room APA09/APA09 and the patient's care was started at 8:29 AM   CSN: [id number]  Arrival date & time 02/12/13  [facility]     Chief Complaint  Patient presents with  . Vaginal Bleeding  . Abdominal Pain     The history is provided by the patient and medical records. No language interpreter was used.    HPI Comments: Brandy Hoover is a 32 y.o. female who presents to the Emergency Department complaining of gradual onset, gradually worsening, constant suprapubic abdominal pain described as sharp with an increase in vaginal bleeding described as heavier than normal with clots that started 2 days ago associated with her normal menses. She reports using 4 to 5 menses pads yesterday and admits that this is her expected menses time. Pt denies any modifying factors and denies radiation of pain. Pt describes the severity of her abdominal pain as 9/10 and denies any other previous episodes of similar abdominal pain. She reports that she has taken ibuprofen to relieve abdominal pain with no improvement. She reports prior episodes of similar pain with her menses and admits that she has a h/o irregular mestrual cycle. LNMP was 12/29/12 and was 6 months prior to that. Pt states she was seen in the ED for the same and was advised to f/u with Dr. Emelda Fear, OB-GYN. Pt states she was unable to due to lack of insurance. She reports that she has followed up with the Health Department for the same and was started on birth control with no improvement so she quit taking it. She is sexually active but denies being on any contraception products or using condoms currently. Pt denies fever, chills, nausea, vomiting, diarrhea, weakness, cough, SOB and any other pain.       Past Medical History  Diagnosis Date  . Panic attacks     Past Surgical History  Procedure Laterality Date  .  Cholecystectomy      Family History  Problem Relation Age of Onset  . Diabetes Mother   . Hypertension Father   . Diabetes Father     History  Substance Use Topics  . Smoking status: Never Smoker   . Smokeless tobacco: Not on file  . Alcohol Use: No    No OB history provided.  Review of Systems  Gastrointestinal: Positive for abdominal pain. Negative for nausea and vomiting.  Genitourinary: Positive for vaginal bleeding. Negative for dysuria.  All other systems reviewed and are negative.    Allergies  Zithromax  Home Medications   Current Outpatient Rx  Name  Route  Sig  Dispense  Refill  . HYDROcodone-acetaminophen (NORCO/VICODIN) 5-325 MG per tablet   Oral   Take 1 tablet by mouth every 4 (four) hours as needed for pain.   15 tablet   0   . ibuprofen (ADVIL,MOTRIN) 200 MG tablet   Oral   Take 600 mg by mouth every 6 (six) hours as needed for pain.         . phenazopyridine (PYRIDIUM) 200 MG tablet   Oral   Take 1 tablet (200 mg total) by mouth 3 (three) times daily as needed for pain.   5 tablet   0   . traMADol (ULTRAM) 50 MG tablet   Oral   Take 50 mg by mouth every 6 (six) hours as needed for pain (for back pain).  BP 137/69  Pulse 90  Temp(Src) 98.7 F (37.1 C) (Oral)  Resp 21  SpO2 97%  LMP 02/10/2013  Physical Exam  Nursing note and vitals reviewed. Constitutional: She is oriented to person, place, and time. She appears well-developed and well-nourished. No distress.  Morbidly obese  HENT:  Head: Normocephalic and atraumatic.  Eyes: EOM are normal.  Neck: Normal range of motion.  Cardiovascular: Normal rate, regular rhythm and normal heart sounds.   Pulmonary/Chest: Effort normal and breath sounds normal.  Abdominal: Soft. She exhibits no distension. There is tenderness.  Moderate suprapubic tenderness  Genitourinary:  Normal external female genitalia. There is a moderate amount of blood in the vaginal vault with cervix  poorly visualized. There is no tenderness on bimanual examination but exam is limited by body habitus. No definite adnexal masses are felt. Unable to assess fundal size.  Musculoskeletal: Normal range of motion.  Neurological: She is alert and oriented to person, place, and time.  Skin: Skin is warm and dry.  Psychiatric: She has a normal mood and affect. Judgment normal.    ED Course  Procedures (including critical care time)  DIAGNOSTIC STUDIES: Oxygen Saturation is 100% on room air, normal by my interpretation.    COORDINATION OF CARE: 8:36 AM Discussed ED treatment with pt and pt agrees to treatment plan.   Results for orders placed during the hospital encounter of 02/12/13  WET PREP, GENITAL      Result Value Range   Yeast Wet Prep HPF POC NONE SEEN  NONE SEEN   Trich, Wet Prep NONE SEEN  NONE SEEN   Clue Cells Wet Prep HPF POC RARE (*) NONE SEEN   WBC, Wet Prep HPF POC NONE SEEN  NONE SEEN  POCT PREGNANCY, URINE      Result Value Range   Preg Test, Ur NEGATIVE  NEGATIVE      1. Dysmenorrhea   2. Menorrhagia       MDM  Dysmenorrhea with menorrhagia. She is given Percocet for her pain but did not get adequate relief. She's given additional dose of Percocet. Old records are reviewed and she was seen here on April 2 for essentially the same complaints.  She got no relief with second dose of Percocet. She is given hydromorphone with relief of pain. Amount of bleeding seen on exam his not excessive, so I do not believe she needs hormonal manipulation today. She is discharged with prescription for hydromorphone. Previously, she had been referred directly to a gynecologist but she could not afford the office visit. She is advised to go through the health department to see if they can get her a referral.  I personally performed the services described in this documentation, which was scribed in my presence. The recorded information has been reviewed and is  accurate.     Brandy Booze, MD 02/12/13 1257

## 2013-02-18 ENCOUNTER — Other Ambulatory Visit (HOSPITAL_COMMUNITY): Payer: Self-pay | Admitting: Nurse Practitioner

## 2013-02-18 ENCOUNTER — Ambulatory Visit (HOSPITAL_COMMUNITY)
Admission: RE | Admit: 2013-02-18 | Discharge: 2013-02-18 | Disposition: A | Discharge status: Home / Self Care | Payer: Self-pay | Source: Ambulatory Visit | Attending: Nurse Practitioner | Admitting: Nurse Practitioner

## 2013-02-18 DIAGNOSIS — N920 Excessive and frequent menstruation with regular cycle: Secondary | ICD-10-CM

## 2013-02-18 DIAGNOSIS — R102 Pelvic and perineal pain: Secondary | ICD-10-CM

## 2013-02-18 DIAGNOSIS — N949 Unspecified condition associated with female genital organs and menstrual cycle: Secondary | ICD-10-CM | POA: Insufficient documentation

## 2013-03-15 ENCOUNTER — Emergency Department (HOSPITAL_COMMUNITY)
Admission: EM | Admit: 2013-03-15 | Discharge: 2013-03-15 | Disposition: A | Discharge status: Home / Self Care | Payer: Self-pay | Attending: Emergency Medicine | Admitting: Emergency Medicine

## 2013-03-15 ENCOUNTER — Emergency Department (HOSPITAL_COMMUNITY): Payer: Self-pay

## 2013-03-15 ENCOUNTER — Encounter (HOSPITAL_COMMUNITY): Payer: Self-pay

## 2013-03-15 DIAGNOSIS — Z8659 Personal history of other mental and behavioral disorders: Secondary | ICD-10-CM | POA: Insufficient documentation

## 2013-03-15 DIAGNOSIS — R61 Generalized hyperhidrosis: Secondary | ICD-10-CM | POA: Insufficient documentation

## 2013-03-15 DIAGNOSIS — R51 Headache: Secondary | ICD-10-CM | POA: Insufficient documentation

## 2013-03-15 DIAGNOSIS — R079 Chest pain, unspecified: Secondary | ICD-10-CM

## 2013-03-15 DIAGNOSIS — R110 Nausea: Secondary | ICD-10-CM | POA: Insufficient documentation

## 2013-03-15 DIAGNOSIS — R519 Headache, unspecified: Secondary | ICD-10-CM

## 2013-03-15 DIAGNOSIS — R0602 Shortness of breath: Secondary | ICD-10-CM | POA: Insufficient documentation

## 2013-03-15 DIAGNOSIS — R071 Chest pain on breathing: Secondary | ICD-10-CM | POA: Insufficient documentation

## 2013-03-15 LAB — CBC WITH DIFFERENTIAL/PLATELET
Basophils Absolute: 0 10*3/uL (ref 0.0–0.1)
Basophils Relative: 0 % (ref 0–1)
Eosinophils Absolute: 0.2 10*3/uL (ref 0.0–0.7)
HCT: 38.2 % (ref 36.0–46.0)
Hemoglobin: 13 g/dL (ref 12.0–15.0)
MCH: 29.6 pg (ref 26.0–34.0)
MCHC: 34 g/dL (ref 30.0–36.0)
Monocytes Absolute: 0.8 10*3/uL (ref 0.1–1.0)
Monocytes Relative: 7 % (ref 3–12)
Neutro Abs: 8.3 10*3/uL — ABNORMAL HIGH (ref 1.7–7.7)
Neutrophils Relative %: 72 % (ref 43–77)
RDW: 13.3 % (ref 11.5–15.5)

## 2013-03-15 LAB — BASIC METABOLIC PANEL
BUN: 11 mg/dL (ref 6–23)
Creatinine, Ser: 0.66 mg/dL (ref 0.50–1.10)
GFR calc Af Amer: >90 mL/min (ref >90)
GFR calc non Af Amer: >90 mL/min (ref >90)
Glucose, Bld: 94 mg/dL (ref 70–99)
Potassium: 4.1 mEq/L (ref 3.5–5.1)

## 2013-03-15 MED ORDER — NAPROXEN 500 MG PO TABS: 500.0000 mg | ORAL_TABLET | Freq: Two times a day (BID) | ORAL | Status: DC

## 2013-03-15 MED ORDER — DIPHENHYDRAMINE HCL 50 MG/ML IJ SOLN
25.0000 mg | Freq: Once | INTRAMUSCULAR | Status: AC
  Administered 2013-03-15: 25 mg via INTRAVENOUS
  Filled 2013-03-15: qty 1

## 2013-03-15 MED ORDER — KETOROLAC TROMETHAMINE 30 MG/ML IJ SOLN
30.0000 mg | Freq: Once | INTRAMUSCULAR | Status: AC
  Administered 2013-03-15: 30 mg via INTRAVENOUS
  Filled 2013-03-15: qty 1

## 2013-03-15 MED ORDER — METOCLOPRAMIDE HCL 5 MG/ML IJ SOLN
10.0000 mg | Freq: Once | INTRAMUSCULAR | Status: AC
  Administered 2013-03-15: 10 mg via INTRAVENOUS
  Filled 2013-03-15: qty 2

## 2013-03-15 MED ORDER — CYCLOBENZAPRINE HCL 10 MG PO TABS: 10.0000 mg | ORAL_TABLET | Freq: Two times a day (BID) | ORAL | Status: DC | PRN

## 2013-03-15 NOTE — ED Provider Notes (Signed)
History     This chart was scribed for Brandy Hutching, MD, MD by Smitty Pluck, ED Scribe. The patient was seen in room APA01/APA01 and the patient's care was started at 7:40 AM.   CSN: [id number]  Arrival date & time 03/15/13  0726   No chief complaint on file.    The history is provided by the patient and medical records. No language interpreter was used.   HPI Comments: Brandy Hoover is a 32 y.o. female who presents to the Emergency Department complaining of constant, moderate left frontal area HA radiating to left occipital area onset today when she awoke.  She reports taking 3 ibuprofen at 7AM. She mentions having chest wall pain under left breast that started when she awoke. Pt states she has SOB that started when she was getting dressed to go to work. She states that current symptoms feel similar to past panic attacks. Pt reports she started birth control medication recently. She reports having nausea and diaphoresis during onset of chest pain. She denies hx of heart complications. BP usually is 120s/70s. Denies recent long travel. Pt denies fever, chills, vomiting, diarrhea, weakness, cough and any other pain.    Past Medical History  Diagnosis Date  . Panic attacks     Past Surgical History  Procedure Laterality Date  . Cholecystectomy      Family History  Problem Relation Age of Onset  . Diabetes Mother   . Hypertension Father   . Diabetes Father     History  Substance Use Topics  . Smoking status: Never Smoker   . Smokeless tobacco: Not on file  . Alcohol Use: No    OB History   Grav Para Term Preterm Abortions TAB SAB Ect Mult Living                  Review of Systems 10 Systems reviewed and all are negative for acute change except as noted in the HPI.   Allergies  Zithromax  Home Medications   Current Outpatient Rx  Name  Route  Sig  Dispense  Refill  . HYDROmorphone (DILAUDID) 2 MG tablet   Oral   Take 1-2 tablets (2-4 mg total) by mouth every 4  (four) hours as needed for pain.   30 tablet   0   . ibuprofen (ADVIL,MOTRIN) 200 MG tablet   Oral   Take 800 mg by mouth every 6 (six) hours as needed for pain.            Pulse 87  Temp(Src) 99 F (37.2 C) (Oral)  Resp 16  Ht 5\' 2"  (1.575 m)  Wt 270 lb (122.471 kg)  BMI 49.37 kg/m2  SpO2 98%  LMP 02/12/2013  Physical Exam  Nursing note and vitals reviewed. Constitutional: She is oriented to person, place, and time. She appears well-developed and well-nourished.  Obese   HENT:  Head: Normocephalic and atraumatic.  Eyes: Conjunctivae and EOM are normal. Pupils are equal, round, and reactive to light.  Neck: Normal range of motion. Neck supple.  Cardiovascular: Normal rate, regular rhythm and normal heart sounds.   Pulmonary/Chest: Effort normal and breath sounds normal. She exhibits tenderness (tender under left breast).  Abdominal: Soft. Bowel sounds are normal. There is no tenderness. There is no rebound and no guarding.  Musculoskeletal: Normal range of motion.  Neurological: She is alert and oriented to person, place, and time.  Skin: Skin is warm and dry.  Psychiatric: She has a normal mood and  affect.    ED Course  Procedures (including critical care time) DIAGNOSTIC STUDIES: Oxygen Saturation is 98% on room air, normal by my interpretation.    COORDINATION OF CARE: 7:44 AM Discussed ED treatment with pt and pt agrees to pain medication, chest xray and EKG.   Medications  ketorolac (TORADOL) 30 MG/ML injection 30 mg (30 mg Intravenous Given 03/15/13 0804)  metoCLOPramide (REGLAN) injection 10 mg (10 mg Intravenous Given 03/15/13 0804)  diphenhydrAMINE (BENADRYL) injection 25 mg (25 mg Intravenous Given 03/15/13 0805)   Results for orders placed during the hospital encounter of 03/15/13  BASIC METABOLIC PANEL      Result Value Range   Sodium 136  135 - 145 mEq/L   Potassium 4.1  3.5 - 5.1 mEq/L   Chloride 102  96 - 112 mEq/L   CO2 25  19 - 32 mEq/L    Glucose, Bld 94  70 - 99 mg/dL   BUN 11  6 - 23 mg/dL   Creatinine, Ser 1.61  0.50 - 1.10 mg/dL   Calcium 8.8  8.4 - 09.6 mg/dL   GFR calc non Af Amer >90  >90 mL/min   GFR calc Af Amer >90  >90 mL/min  CBC WITH DIFFERENTIAL      Result Value Range   WBC 11.6 (*) 4.0 - 10.5 K/uL   RBC 4.39  3.87 - 5.11 MIL/uL   Hemoglobin 13.0  12.0 - 15.0 g/dL   HCT 04.5  40.9 - 81.1 %   MCV 87.0  78.0 - 100.0 fL   MCH 29.6  26.0 - 34.0 pg   MCHC 34.0  30.0 - 36.0 g/dL   RDW 91.4  78.2 - 95.6 %   Platelets 340  150 - 400 K/uL   Neutrophils Relative % 72  43 - 77 %   Neutro Abs 8.3 (*) 1.7 - 7.7 K/uL   Lymphocytes Relative 19  12 - 46 %   Lymphs Abs 2.2  0.7 - 4.0 K/uL   Monocytes Relative 7  3 - 12 %   Monocytes Absolute 0.8  0.1 - 1.0 K/uL   Eosinophils Relative 2  0 - 5 %   Eosinophils Absolute 0.2  0.0 - 0.7 K/uL   Basophils Relative 0  0 - 1 %   Basophils Absolute 0.0  0.0 - 0.1 K/uL  TROPONIN I      Result Value Range   Troponin I <0.30  <0.30 ng/mL   Dg Chest 2 View  03/15/2013   *RADIOLOGY REPORT*  Clinical Data: Left chest pain with shortness of breath today.  CHEST - 2 VIEW  Comparison: Abdominal CT 12/30/2012.  Chest radiographs 03/16/2012.  Findings: There are stable low lung volumes.  The heart size and mediastinal contours are stable.  The lungs are clear.  There is no pleural effusion or pneumothorax.  No acute osseous abnormalities are identified.  Telemetry leads overlie the chest.  IMPRESSION: Stable examination.  No acute cardiopulmonary process identified.   Original Report Authenticated By: Carey Bullocks, M.D.      Date: 03/15/2013  Rate: 85  Rhythm: normal sinus rhythm  QRS Axis: normal  Intervals: normal  ST/T Wave abnormalities: normal  Conduction Disutrbances: none  Narrative Interpretation: unremarkable       No results found.   No diagnosis found.    MDM  History and physical not consistent with acute coronary syndrome or pulmonary embolism. No  neurological deficits. Patient feeling better after IV fluids and pain medicine.  Screening labs, chest x-ray, EKG normal.  Discharge meds Flexeril 10 mg #20 and Naprosyn 500 mg #20     I personally performed the services described in this documentation, which was scribed in my presence. The recorded information has been reviewed and is accurate.    Brandy Hutching, MD 03/15/13 [phone]

## 2013-03-15 NOTE — ED Notes (Signed)
Pt complain of chest pain under left breast that started around 0700 this morning. States she has had chest wall pain in the past

## 2013-05-06 ENCOUNTER — Emergency Department (HOSPITAL_COMMUNITY)
Admission: EM | Admit: 2013-05-06 | Discharge: 2013-05-06 | Disposition: A | Discharge status: Home / Self Care | Payer: Self-pay | Attending: Emergency Medicine | Admitting: Emergency Medicine

## 2013-05-06 ENCOUNTER — Emergency Department (HOSPITAL_COMMUNITY): Payer: Self-pay

## 2013-05-06 DIAGNOSIS — R079 Chest pain, unspecified: Secondary | ICD-10-CM

## 2013-05-06 DIAGNOSIS — R51 Headache: Secondary | ICD-10-CM | POA: Insufficient documentation

## 2013-05-06 DIAGNOSIS — F410 Panic disorder [episodic paroxysmal anxiety] without agoraphobia: Secondary | ICD-10-CM | POA: Insufficient documentation

## 2013-05-06 DIAGNOSIS — R0602 Shortness of breath: Secondary | ICD-10-CM | POA: Insufficient documentation

## 2013-05-06 DIAGNOSIS — R0789 Other chest pain: Secondary | ICD-10-CM | POA: Insufficient documentation

## 2013-05-06 DIAGNOSIS — R0989 Other specified symptoms and signs involving the circulatory and respiratory systems: Secondary | ICD-10-CM | POA: Insufficient documentation

## 2013-05-06 DIAGNOSIS — Z3202 Encounter for pregnancy test, result negative: Secondary | ICD-10-CM | POA: Insufficient documentation

## 2013-05-06 DIAGNOSIS — R0609 Other forms of dyspnea: Secondary | ICD-10-CM | POA: Insufficient documentation

## 2013-05-06 LAB — CBC
HCT: 36.8 % (ref 36.0–46.0)
MCHC: 33.7 g/dL (ref 30.0–36.0)
MCV: 84.6 fL (ref 78.0–100.0)
Platelets: 318 10*3/uL (ref 150–400)
RDW: 14 % (ref 11.5–15.5)
WBC: 11.8 10*3/uL — ABNORMAL HIGH (ref 4.0–10.5)

## 2013-05-06 LAB — URINALYSIS, ROUTINE W REFLEX MICROSCOPIC
Bilirubin Urine: NEGATIVE
Ketones, ur: NEGATIVE mg/dL
Leukocytes, UA: NEGATIVE
Nitrite: NEGATIVE
Protein, ur: NEGATIVE mg/dL
Urobilinogen, UA: 0.2 mg/dL (ref 0.0–1.0)
pH: 7.5 (ref 5.0–8.0)

## 2013-05-06 LAB — BASIC METABOLIC PANEL
BUN: 9 mg/dL (ref 6–23)
Calcium: 9 mg/dL (ref 8.4–10.5)
Creatinine, Ser: 0.74 mg/dL (ref 0.50–1.10)
GFR calc Af Amer: >90 mL/min (ref >90)
GFR calc non Af Amer: >90 mL/min (ref >90)

## 2013-05-06 MED ORDER — LORAZEPAM 1 MG PO TABS
1.0000 mg | ORAL_TABLET | Freq: Once | ORAL | Status: AC
  Administered 2013-05-06: 1 mg via ORAL
  Filled 2013-05-06: qty 1

## 2013-05-06 MED ORDER — IBUPROFEN 800 MG PO TABS
800.0000 mg | ORAL_TABLET | Freq: Once | ORAL | Status: AC
  Administered 2013-05-06: 800 mg via ORAL
  Filled 2013-05-06: qty 1

## 2013-05-06 NOTE — ED Notes (Signed)
Pt discharged.Vital signs stable and GCS 15 

## 2013-05-06 NOTE — ED Notes (Signed)
Per ems.  Pt arrived at work and thinks she had a panic attack.  Pt has hx of same.  Pt resp rate was initially was 25-30 but with coaching pt able to slow rate to low 20s and felt better.  Pt stated her mother was admitted to [facility] and pt wanted to be transported to [facility] but [facility].  Pt reported headache during transprort.  140/90, 90 hr 98% ra.  Pt denies any other symptoms.  Pt currently alert oriented X4 and on talking on cell phone.

## 2013-05-06 NOTE — ED Notes (Signed)
Pt arrived via [facility], pt at this time is changing into a gown

## 2013-05-06 NOTE — ED Provider Notes (Signed)
CSN: [id number]     Arrival date & time 05/06/13  0910 History     First MD Initiated Contact with Patient 05/06/13 0920     Chief Complaint  Patient presents with  . Panic Attack   (Consider location/radiation/quality/duration/timing/severity/associated sxs/prior Treatment) HPI Brandy Hoover is a(n) 32 y.o. female who presents with cc dyspnea, chest pain and headache. Patient was driving to work today when she had sudden onset shortness of breath and chest pain which she describes and central, tight, squeezing and non-pleuritic. She had a feeling of hot and cold chills and felt that she could not cathc her breath and that she might pass out. She was able to get to work where EMS was called. She has a pmh of chest wall spasms and panic attacks. EMS notes state that she was tachypneic, but with coaching was able to decrease her rate. Denies history of dvt/PE, UL leg swelling or pain, denied risk factors for PE.   Past Medical History  Diagnosis Date  . Panic attacks    Past Surgical History  Procedure Laterality Date  . Cholecystectomy     Family History  Problem Relation Age of Onset  . Diabetes Mother   . Hypertension Father   . Diabetes Father    History  Substance Use Topics  . Smoking status: Never Smoker   . Smokeless tobacco: Not on file  . Alcohol Use: No   OB History   Grav Para Term Preterm Abortions TAB SAB Ect Mult Living                 Review of Systems Ten systems reviewed and are negative for acute change, except as noted in the HPI.   Allergies  Zithromax  Home Medications   Current Outpatient Rx  Name  Route  Sig  Dispense  Refill  . ibuprofen (ADVIL,MOTRIN) 200 MG tablet   Oral   Take 400-600 mg by mouth every 6 (six) hours as needed for pain or headache.          . norethindrone-ethinyl estradiol (BREVICON, 28,) 0.5-35 MG-MCG tablet   Oral   Take 1 tablet by mouth daily.          BP 132/56  Pulse 78  Temp(Src) 98.1 F (36.7 C) (Oral)   Resp 22  Ht 5\' 2"  (1.575 m)  Wt 270 lb (122.471 kg)  BMI 49.37 kg/m2  SpO2 100% Physical Exam Physical Exam  Nursing note and vitals reviewed. Constitutional: She is oriented to person, place, and time. She is Morbidly obese. She does not appear distressed. HENT:  Head: Notmocephalic and atraumatic.  Eyes: Conjunctivae normal and EOM are normal. Pupils are equal, round, and reactive to light. No scleral icterus.  Neck: Normal range of motion.  Cardiovascular: Normal rate, regular rhythm and normal heart sounds.  Exam reveals no gallop and no friction rub.  No peripheral edema, UL leg swelling or calf tenderness. No murmur heard. Pulmonary/Chest: Effort normal and breath sounds normal. No respiratory distress. TTP in the pecs, same as her chest pain.  Abdominal: Soft. Bowel sounds are normal. She exhibits no distension and no mass. There is no tenderness. There is no guarding.  Neurological: She is alert and oriented to person, place, and time.  Skin: Skin is warm and dry. She is not diaphoretic.    ED Course   Procedures (including critical care time)  Labs Reviewed  CBC - Abnormal; Notable for the following:    WBC 11.8 (*)  All other components within normal limits  BASIC METABOLIC PANEL - Abnormal; Notable for the following:    Glucose, Bld 107 (*)    All other components within normal limits  URINALYSIS, ROUTINE W REFLEX MICROSCOPIC - Abnormal; Notable for the following:    APPearance CLOUDY (*)    All other components within normal limits  TROPONIN I  POCT PREGNANCY, URINE     Date: 05/06/2013  Rate: 80  Rhythm: normal sinus rhythm  QRS Axis: normal  Intervals: normal  ST/T Wave abnormalities: normal  Conduction Disutrbances:none  Narrative Interpretation:   Old EKG Reviewed: none available   No results found. 1. Chest pain   2. Panic attack     MDM  Patient EKG unconcerning for ischemia. She is WELLS low risk and PERC negative    1:03 PM .BP 132/56   Pulse 78  Temp(Src) 98.1 F (36.7 C) (Oral)  Resp 22  Ht 5\' 2"  (1.575 m)  Wt 270 lb (122.471 kg)  BMI 49.37 kg/m2  SpO2 100%  LMP 04/22/2013 Patient states that she is feeling much better. She has a history of chest wall spasms and states that this feels the same.  Cutaneous have a headache however she denies any further dyspnea.  Her HEART score is 2 (Low Score (0-3 points), risk of MACE of 0.9-1.7%.)  Putting her at low risk for major adverse cardiac event.   Patient is to be discharged with recommendation to follow up with PCP in regards to today's hospital visit. Chest pain is not likely of cardiac or pulmonary etiology d/t presentation, perc negative, VSS, no tracheal deviation, no JVD or new murmur, RRR, breath sounds equal bilaterally, EKG without acute abnormalities, negative troponin, and negative CXR. Pt has been advised start a PPI and return to the ED is CP becomes exertional, associated with diaphoresis or nausea, radiates to left jaw/arm, worsens or becomes concerning in any way. Pt appears reliable for follow up and is agreeable to discharge.   Case has been discussed with and seen by Dr. Bernette Mayers who agrees with the above plan to discharge.          Arthor Captain, PA-C 05/08/13 2152

## 2013-05-12 NOTE — ED Provider Notes (Signed)
Medical screening examination/treatment/procedure(s) were performed by non-physician practitioner and as supervising physician I was immediately available for consultation/collaboration.   Charles B. Sheldon, MD 05/12/13 1113 

## 2013-05-18 ENCOUNTER — Encounter (HOSPITAL_COMMUNITY): Payer: Self-pay | Admitting: *Deleted

## 2013-05-18 ENCOUNTER — Emergency Department (HOSPITAL_COMMUNITY): Payer: Self-pay

## 2013-05-18 ENCOUNTER — Emergency Department (HOSPITAL_COMMUNITY)
Admission: EM | Admit: 2013-05-18 | Discharge: 2013-05-18 | Disposition: A | Discharge status: Home / Self Care | Payer: Self-pay | Attending: Emergency Medicine | Admitting: Emergency Medicine

## 2013-05-18 DIAGNOSIS — R112 Nausea with vomiting, unspecified: Secondary | ICD-10-CM | POA: Insufficient documentation

## 2013-05-18 DIAGNOSIS — R197 Diarrhea, unspecified: Secondary | ICD-10-CM | POA: Insufficient documentation

## 2013-05-18 DIAGNOSIS — M549 Dorsalgia, unspecified: Secondary | ICD-10-CM | POA: Insufficient documentation

## 2013-05-18 DIAGNOSIS — Z91040 Latex allergy status: Secondary | ICD-10-CM | POA: Insufficient documentation

## 2013-05-18 DIAGNOSIS — F410 Panic disorder [episodic paroxysmal anxiety] without agoraphobia: Secondary | ICD-10-CM | POA: Insufficient documentation

## 2013-05-18 DIAGNOSIS — F419 Anxiety disorder, unspecified: Secondary | ICD-10-CM

## 2013-05-18 DIAGNOSIS — R51 Headache: Secondary | ICD-10-CM | POA: Insufficient documentation

## 2013-05-18 DIAGNOSIS — Z79899 Other long term (current) drug therapy: Secondary | ICD-10-CM | POA: Insufficient documentation

## 2013-05-18 DIAGNOSIS — R079 Chest pain, unspecified: Secondary | ICD-10-CM | POA: Insufficient documentation

## 2013-05-18 DIAGNOSIS — F29 Unspecified psychosis not due to a substance or known physiological condition: Secondary | ICD-10-CM | POA: Insufficient documentation

## 2013-05-18 DIAGNOSIS — R0602 Shortness of breath: Secondary | ICD-10-CM | POA: Insufficient documentation

## 2013-05-18 DIAGNOSIS — R5381 Other malaise: Secondary | ICD-10-CM | POA: Insufficient documentation

## 2013-05-18 LAB — BASIC METABOLIC PANEL
CO2: 23 mEq/L (ref 19–32)
Calcium: 8.9 mg/dL (ref 8.4–10.5)
Creatinine, Ser: 0.61 mg/dL (ref 0.50–1.10)
GFR calc non Af Amer: >90 mL/min (ref >90)
Glucose, Bld: 93 mg/dL (ref 70–99)

## 2013-05-18 LAB — CBC WITH DIFFERENTIAL/PLATELET
Basophils Absolute: 0.1 10*3/uL (ref 0.0–0.1)
Eosinophils Absolute: 0.1 10*3/uL (ref 0.0–0.7)
Eosinophils Relative: 1 % (ref 0–5)
HCT: 39 % (ref 36.0–46.0)
Lymphocytes Relative: 15 % (ref 12–46)
MCH: 28.9 pg (ref 26.0–34.0)
MCV: 85.5 fL (ref 78.0–100.0)
Monocytes Absolute: 0.6 10*3/uL (ref 0.1–1.0)
RDW: 13.9 % (ref 11.5–15.5)
WBC: 12.1 10*3/uL — ABNORMAL HIGH (ref 4.0–10.5)

## 2013-05-18 LAB — D-DIMER, QUANTITATIVE: D-Dimer, Quant: 0.37 ug/mL-FEU (ref 0.00–0.48)

## 2013-05-18 MED ORDER — LORAZEPAM 1 MG PO TABS
1.0000 mg | ORAL_TABLET | Freq: Once | ORAL | Status: AC
  Administered 2013-05-18: 1 mg via ORAL
  Filled 2013-05-18: qty 1

## 2013-05-18 MED ORDER — LORAZEPAM 1 MG PO TABS: 1.0000 mg | ORAL_TABLET | Freq: Three times a day (TID) | ORAL | Status: DC | PRN

## 2013-05-18 MED ORDER — HYDROCODONE-ACETAMINOPHEN 5-325 MG PO TABS
1.0000 | ORAL_TABLET | Freq: Once | ORAL | Status: AC
  Administered 2013-05-18: 1 via ORAL
  Filled 2013-05-18: qty 1

## 2013-05-18 MED ORDER — TRAMADOL HCL 50 MG PO TABS: 50.0000 mg | ORAL_TABLET | Freq: Four times a day (QID) | ORAL | Status: DC | PRN

## 2013-05-18 NOTE — ED Notes (Signed)
Pt presents with c/o anxiety and " stress chest pain like before". Pt reports has a stressful job with causes anxiety for her. Has had same chest pain in mid sternum through to her back per pt. Denies SOB, radiating pain, diaphoresis and n/v at this time. Pt reports has never been diagnosed with anxiety, however has been treated before " as if I had anxiety". Pt denies RX of  medication for home use for said anxiety. Pt is comfortable resting on stretcher with NAD noted at this time. Will continue to monitor.

## 2013-05-18 NOTE — ED Provider Notes (Signed)
CSN: [id number]     Arrival date & time 05/18/13  2130 History    This chart was scribed for Shelda Jakes, MD,  by Ashley Jacobs, ED Scribe. The patient was seen in room APA08/APA08 and the patient's care was started at 11:49 AM.   First MD Initiated Contact with Patient 05/18/13 1119     Chief Complaint  Patient presents with  . Anxiety   (Consider location/radiation/quality/duration/timing/severity/associated sxs/prior Treatment) Patient is a 32 y.o. female presenting with anxiety. The history is provided by the patient and medical records. No language interpreter was used.  Anxiety This is a new problem. The current episode started 3 to 5 hours ago. The problem occurs constantly. The problem has not changed since onset.Associated symptoms include headaches. Pertinent negatives include no chest pain, no abdominal pain and no shortness of breath. Nothing aggravates the symptoms. Nothing relieves the symptoms. She has tried nothing for the symptoms.   HPI Comments: Brandy Hoover is a 32 y.o. female who presents to the Emergency Department complaining anxiety that presented two days of ago with the onset of chest pain. She describes the pain as 7/10, constant, stabbing, mid center chest pain that radiates to her back. Pt also mentions the associated symptoms  nausea, generalized weakness, diarrhea, vomiting and SOB.  She states having a headache and describes it as tightness in the back of her head. She has a hx with panic attacks and mentions that the pain is similar in nature. She denies fever, chills, and abdominal pain. Nothing seems to relieve or worsen the symptoms.   She denies having a current PCP.  Past Medical History  Diagnosis Date  . Panic attacks    Past Surgical History  Procedure Laterality Date  . Cholecystectomy     Family History  Problem Relation Age of Onset  . Diabetes Mother   . Hypertension Father   . Diabetes Father    History  Substance Use Topics  .  Smoking status: Never Smoker   . Smokeless tobacco: Not on file  . Alcohol Use: No   OB History   Grav Para Term Preterm Abortions TAB SAB Ect Mult Living                 Review of Systems  Constitutional: Negative for fever and chills.  HENT: Negative for congestion, sore throat, rhinorrhea and neck pain.   Respiratory: Negative for shortness of breath.   Cardiovascular: Negative for chest pain.  Gastrointestinal: Positive for nausea, vomiting and diarrhea. Negative for abdominal pain and constipation.  Genitourinary: Negative for dysuria.  Musculoskeletal: Positive for back pain.  Skin: Negative for rash.  Neurological: Positive for weakness and headaches.  Hematological: Does not bruise/bleed easily.  Psychiatric/Behavioral: Positive for confusion.    Allergies  Latex and Zithromax  Home Medications   Current Outpatient Rx  Name  Route  Sig  Dispense  Refill  . ibuprofen (ADVIL,MOTRIN) 200 MG tablet   Oral   Take 400-600 mg by mouth every 6 (six) hours as needed for pain or headache.          . norethindrone-ethinyl estradiol (BREVICON, 28,) 0.5-35 MG-MCG tablet   Oral   Take 1 tablet by mouth at bedtime.          Marland Kitchen LORazepam (ATIVAN) 1 MG tablet   Oral   Take 1 tablet (1 mg total) by mouth 3 (three) times daily as needed for anxiety.   10 tablet   0   .  traMADol (ULTRAM) 50 MG tablet   Oral   Take 1 tablet (50 mg total) by mouth every 6 (six) hours as needed.   14 tablet   0    BP 118/65  Pulse 98  Temp(Src) 98.3 F (36.8 C) (Oral)  Resp 20  Ht 5\' 2"  (1.575 m)  Wt 270 lb (122.471 kg)  BMI 49.37 kg/m2  SpO2 99%  LMP 04/17/2013 Physical Exam  Nursing note and vitals reviewed. Constitutional: She is oriented to person, place, and time. She appears well-developed and well-nourished. No distress.  HENT:  Head: Normocephalic and atraumatic.  Mouth/Throat: Oropharynx is clear and moist.  Eyes: Conjunctivae are normal. Pupils are equal, round, and  reactive to light. No scleral icterus.  Neck: Neck supple.  Cardiovascular: Normal rate, regular rhythm, normal heart sounds and intact distal pulses.   No murmur heard. Pulmonary/Chest: Effort normal and breath sounds normal. No stridor. No respiratory distress. She has no rales.  Abdominal: Soft. Bowel sounds are normal. She exhibits no distension. There is no tenderness.  Musculoskeletal: Normal range of motion.  Neurological: She is alert and oriented to person, place, and time.  Skin: Skin is warm and dry. No rash noted.  Psychiatric: She has a normal mood and affect. Her behavior is normal.    ED Course  DIAGNOSTIC STUDIES: Oxygen Saturation is 99% on room air, normal by my interpretation.    COORDINATION OF CARE: 12:03 PM Discussed course of care with pt. Pt understands and agrees. Medications  LORazepam (ATIVAN) tablet 1 mg (1 mg Oral Given 05/18/13 1250)  HYDROcodone-acetaminophen (NORCO/VICODIN) 5-325 MG per tablet 1 tablet (1 tablet Oral Given 05/18/13 1250)     Procedures (including critical care time)  Labs Reviewed  CBC WITH DIFFERENTIAL - Abnormal; Notable for the following:    WBC 12.1 (*)    Neutrophils Relative % 79 (*)    Neutro Abs 9.6 (*)    All other components within normal limits  TROPONIN I  BASIC METABOLIC PANEL  D-DIMER, QUANTITATIVE   Dg Chest 2 View  05/18/2013   *RADIOLOGY REPORT*  Clinical Data: Chest pain.  CHEST - 2 VIEW  Comparison: 05/06/2013.  Findings: Trachea is midline.  Heart size normal.  Lungs are clear. No pleural fluid.  IMPRESSION: No acute findings.   Original Report Authenticated By: Leanna Battles, M.D.   Results for orders placed during the hospital encounter of 05/18/13  TROPONIN I      Result Value Range   Troponin I <0.30  <0.30 ng/mL  CBC WITH DIFFERENTIAL      Result Value Range   WBC 12.1 (*) 4.0 - 10.5 K/uL   RBC 4.56  3.87 - 5.11 MIL/uL   Hemoglobin 13.2  12.0 - 15.0 g/dL   HCT 96.0  45.4 - 09.8 %   MCV 85.5  78.0  - 100.0 fL   MCH 28.9  26.0 - 34.0 pg   MCHC 33.8  30.0 - 36.0 g/dL   RDW 11.9  14.7 - 82.9 %   Platelets 337  150 - 400 K/uL   Neutrophils Relative % 79 (*) 43 - 77 %   Neutro Abs 9.6 (*) 1.7 - 7.7 K/uL   Lymphocytes Relative 15  12 - 46 %   Lymphs Abs 1.8  0.7 - 4.0 K/uL   Monocytes Relative 5  3 - 12 %   Monocytes Absolute 0.6  0.1 - 1.0 K/uL   Eosinophils Relative 1  0 - 5 %  Eosinophils Absolute 0.1  0.0 - 0.7 K/uL   Basophils Relative 1  0 - 1 %   Basophils Absolute 0.1  0.0 - 0.1 K/uL  BASIC METABOLIC PANEL      Result Value Range   Sodium 137  135 - 145 mEq/L   Potassium 4.1  3.5 - 5.1 mEq/L   Chloride 103  96 - 112 mEq/L   CO2 23  19 - 32 mEq/L   Glucose, Bld 93  70 - 99 mg/dL   BUN 7  6 - 23 mg/dL   Creatinine, Ser 3.24  0.50 - 1.10 mg/dL   Calcium 8.9  8.4 - 40.1 mg/dL   GFR calc non Af Amer >90  >90 mL/min   GFR calc Af Amer >90  >90 mL/min  D-DIMER, QUANTITATIVE      Result Value Range   D-Dimer, Quant 0.37  0.00 - 0.48 ug/mL-FEU    Date: 05/18/2013  Rate: 78  Rhythm: normal sinus rhythm  QRS Axis: normal  Intervals: normal  ST/T Wave abnormalities: normal  Conduction Disutrbances:none  Narrative Interpretation:   Old EKG Reviewed: unchanged No change in EKG compared to 05/06/2013    1. Chest pain   2. Anxiety     MDM  This patient's second visit for similar complaints. Primary care doctors health department. Today's workup negative for evidence of pulmonary embolus or deep vein thrombosis chest x-rays negative for pneumonia, pulmonary edema or pneumothorax. Troponin negative EKG without acute changes. Suspect a component of anxiety noncardiac type chest pain recommend followup with day [facility].     I personally performed the services described in this documentation, which was scribed in my presence. The recorded information has been reviewed and is accurate.     Shelda Jakes, MD 05/18/13 [phone]

## 2013-05-18 NOTE — ED Notes (Signed)
Pt c/o " I think I having an anxiety attack" reports that she has been having mid center chest pain with nausea since Sunday, pain is described as stabbing through to her back area. States that she has problems with anxiety in the past and that this pain is the same.

## 2014-05-06 ENCOUNTER — Emergency Department (HOSPITAL_COMMUNITY): Payer: Self-pay

## 2014-05-06 ENCOUNTER — Encounter (HOSPITAL_COMMUNITY): Payer: Self-pay | Admitting: Emergency Medicine

## 2014-05-06 ENCOUNTER — Emergency Department (HOSPITAL_COMMUNITY)
Admission: EM | Admit: 2014-05-06 | Discharge: 2014-05-06 | Disposition: A | Discharge status: Home / Self Care | Payer: Self-pay | Attending: Emergency Medicine | Admitting: Emergency Medicine

## 2014-05-06 DIAGNOSIS — S39012A Strain of muscle, fascia and tendon of lower back, initial encounter: Secondary | ICD-10-CM

## 2014-05-06 DIAGNOSIS — Z91040 Latex allergy status: Secondary | ICD-10-CM | POA: Insufficient documentation

## 2014-05-06 DIAGNOSIS — Z23 Encounter for immunization: Secondary | ICD-10-CM | POA: Insufficient documentation

## 2014-05-06 DIAGNOSIS — W010XXA Fall on same level from slipping, tripping and stumbling without subsequent striking against object, initial encounter: Secondary | ICD-10-CM | POA: Insufficient documentation

## 2014-05-06 DIAGNOSIS — M25531 Pain in right wrist: Secondary | ICD-10-CM

## 2014-05-06 DIAGNOSIS — Y929 Unspecified place or not applicable: Secondary | ICD-10-CM | POA: Insufficient documentation

## 2014-05-06 DIAGNOSIS — F411 Generalized anxiety disorder: Secondary | ICD-10-CM | POA: Insufficient documentation

## 2014-05-06 DIAGNOSIS — S59909A Unspecified injury of unspecified elbow, initial encounter: Secondary | ICD-10-CM | POA: Insufficient documentation

## 2014-05-06 DIAGNOSIS — IMO0002 Reserved for concepts with insufficient information to code with codable children: Secondary | ICD-10-CM | POA: Insufficient documentation

## 2014-05-06 DIAGNOSIS — R52 Pain, unspecified: Secondary | ICD-10-CM | POA: Insufficient documentation

## 2014-05-06 DIAGNOSIS — S335XXA Sprain of ligaments of lumbar spine, initial encounter: Secondary | ICD-10-CM | POA: Insufficient documentation

## 2014-05-06 DIAGNOSIS — Y9389 Activity, other specified: Secondary | ICD-10-CM | POA: Insufficient documentation

## 2014-05-06 DIAGNOSIS — S6990XA Unspecified injury of unspecified wrist, hand and finger(s), initial encounter: Secondary | ICD-10-CM

## 2014-05-06 DIAGNOSIS — S59919A Unspecified injury of unspecified forearm, initial encounter: Secondary | ICD-10-CM

## 2014-05-06 DIAGNOSIS — T07XXXA Unspecified multiple injuries, initial encounter: Secondary | ICD-10-CM

## 2014-05-06 DIAGNOSIS — Z79899 Other long term (current) drug therapy: Secondary | ICD-10-CM | POA: Insufficient documentation

## 2014-05-06 MED ORDER — BACITRACIN-NEOMYCIN-POLYMYXIN 400-5-5000 EX OINT
TOPICAL_OINTMENT | CUTANEOUS | Status: AC
  Filled 2014-05-06: qty 1

## 2014-05-06 MED ORDER — BACITRACIN-NEOMYCIN-POLYMYXIN 400-5-5000 EX OINT
TOPICAL_OINTMENT | Freq: Once | CUTANEOUS | Status: AC
  Administered 2014-05-06: 1 via TOPICAL

## 2014-05-06 MED ORDER — CYCLOBENZAPRINE HCL 10 MG PO TABS: 10.0000 mg | ORAL_TABLET | Freq: Two times a day (BID) | ORAL | Status: DC | PRN

## 2014-05-06 MED ORDER — DIAZEPAM 5 MG PO TABS
5.0000 mg | ORAL_TABLET | Freq: Once | ORAL | Status: AC
  Administered 2014-05-06: 5 mg via ORAL
  Filled 2014-05-06: qty 1

## 2014-05-06 MED ORDER — TETANUS-DIPHTH-ACELL PERTUSSIS 5-2.5-18.5 LF-MCG/0.5 IM SUSP
0.5000 mL | Freq: Once | INTRAMUSCULAR | Status: AC
  Administered 2014-05-06: 0.5 mL via INTRAMUSCULAR
  Filled 2014-05-06: qty 0.5

## 2014-05-06 MED ORDER — KETOROLAC TROMETHAMINE 10 MG PO TABS
10.0000 mg | ORAL_TABLET | Freq: Once | ORAL | Status: AC
  Administered 2014-05-06: 10 mg via ORAL
  Filled 2014-05-06: qty 1

## 2014-05-06 MED ORDER — TRAMADOL HCL 50 MG PO TABS
50.0000 mg | ORAL_TABLET | Freq: Four times a day (QID) | ORAL | Status: DC | PRN
Start: 2014-05-06 — End: 2014-06-20

## 2014-05-06 NOTE — ED Provider Notes (Signed)
CSN: [id number]     Arrival date & time 05/06/14  0003 History   First MD Initiated Contact with Patient 05/06/14 0008     No chief complaint on file.    (Consider location/radiation/quality/duration/timing/severity/associated sxs/prior Treatment) HPI Comments: Patient is a 33 year old female who presents to the emergency department after sustaining a fall on a wet surface just prior to arrival to the emergency department. The patient states that her feet slipped from under her, and she sustained injury to the right wrist, the lower back, and the left second toe. The patient denies any loss of consciousness. She presents now for assistance with these multiple problems.  Patient is a 33 y.o. female presenting with fall. The history is provided by the patient.  Fall This is a new problem. The current episode started today. Pertinent negatives include no abdominal pain, arthralgias, chest pain, coughing or neck pain.    Past Medical History  Diagnosis Date  . Panic attacks    Past Surgical History  Procedure Laterality Date  . Cholecystectomy     Family History  Problem Relation Age of Onset  . Diabetes Mother   . Hypertension Father   . Diabetes Father    History  Substance Use Topics  . Smoking status: Never Smoker   . Smokeless tobacco: Not on file  . Alcohol Use: No   OB History   Grav Para Term Preterm Abortions TAB SAB Ect Mult Living                 Review of Systems  Constitutional: Negative for activity change.       All ROS Neg except as noted in HPI  HENT: Negative for nosebleeds.   Eyes: Negative for photophobia and discharge.  Respiratory: Negative for cough, shortness of breath and wheezing.   Cardiovascular: Negative for chest pain and palpitations.  Gastrointestinal: Negative for abdominal pain and blood in stool.  Genitourinary: Negative for dysuria, frequency and hematuria.  Musculoskeletal: Positive for back pain. Negative for arthralgias and neck pain.   Skin: Negative.   Neurological: Negative for dizziness, seizures and speech difficulty.  Psychiatric/Behavioral: Negative for hallucinations and confusion. The patient is nervous/anxious.       Allergies  Latex and Zithromax  Home Medications   Prior to Admission medications   Medication Sig Start Date End Date Taking? Authorizing Provider  ibuprofen (ADVIL,MOTRIN) 200 MG tablet Take 400-600 mg by mouth every 6 (six) hours as needed for pain or headache.     Historical Provider, MD  LORazepam (ATIVAN) 1 MG tablet Take 1 tablet (1 mg total) by mouth 3 (three) times daily as needed for anxiety. 05/18/13   Vanetta MuldersScott Zackowski, MD  norethindrone-ethinyl estradiol (BREVICON, 28,) 0.5-35 MG-MCG tablet Take 1 tablet by mouth at bedtime.     Historical Provider, MD  traMADol (ULTRAM) 50 MG tablet Take 1 tablet (50 mg total) by mouth every 6 (six) hours as needed. 05/18/13   Vanetta MuldersScott Zackowski, MD   There were no vitals taken for this visit. Physical Exam  Nursing note and vitals reviewed. Constitutional: She is oriented to person, place, and time. She appears well-developed and well-nourished.  Non-toxic appearance.  HENT:  Head: Normocephalic.  Right Ear: Tympanic membrane and external ear normal.  Left Ear: Tympanic membrane and external ear normal.  Eyes: EOM and lids are normal. Pupils are equal, round, and reactive to light.  Neck: Normal range of motion. Neck supple. Carotid bruit is not present.  Cardiovascular: Normal rate, regular  rhythm, normal heart sounds, intact distal pulses and normal pulses.   Pulmonary/Chest: Breath sounds normal. No respiratory distress.  Abdominal: Soft. Bowel sounds are normal. There is no tenderness. There is no guarding.  Musculoskeletal: Normal range of motion.  There are shallow abrasions present of the first and second toe of the left foot. There is pain with motion of the toes. The Achilles tendon is intact. The dorsalis pedis pulses 2+. The capillary  refill is less than 2 seconds.  Lymphadenopathy:       Head (right side): No submandibular adenopathy present.       Head (left side): No submandibular adenopathy present.    She has no cervical adenopathy.  Neurological: She is alert and oriented to person, place, and time. She has normal strength. No cranial nerve deficit or sensory deficit.  Skin: Skin is warm and dry.  Psychiatric: She has a normal mood and affect. Her speech is normal.    ED Course  Procedures (including critical care time) Labs Review Labs Reviewed - No data to display  Imaging Review No results found.   EKG Interpretation None      MDM Xray of the right wrist is negative. X-ray of the second left toe is negative. X-ray of the lumbar spine reveals degenerative disc changes and spurring at the L3-L4 and L5-S1 area. There is mild disc space narrowing at the L4-L5 area. There is no lumbar fracture.  No fractures noted. No dislocations noted. No suturable lacerations appreciated. The patient has a history of degenerative disc disease. No gross neurologic deficits appreciated on tonight exam.  The plan at this time is for the patient to be treated with muscle relaxer, anti-inflammatory medication, and rest. Work note written for the patient to return on Monday, August 10.    Final diagnoses:  None    **I have reviewed nursing notes, vital signs, and all appropriate lab and imaging results for this patient.Kathie Dike, PA-C 05/06/14   Kathie Dike, PA-C 05/06/14 [phone]

## 2014-05-06 NOTE — ED Provider Notes (Signed)
Medical screening examination/treatment/procedure(s) were performed by non-physician practitioner and as supervising physician I was immediately available for consultation/collaboration.    Vida RollerBrian D Nagee Goates, MD 05/06/14 [phone]

## 2014-05-06 NOTE — ED Notes (Signed)
PA at patient bedside for assessment/update  

## 2014-05-06 NOTE — ED Notes (Signed)
PA in with pt for reassesment

## 2014-05-06 NOTE — ED Notes (Signed)
Patient states she slipped on concrete about 30 minutes ago and fell.  Patient c/o right wrist, lower back and left second toe pain.

## 2014-05-06 NOTE — Discharge Instructions (Signed)
Your x-rays are negative for fracture or dislocation. The x-ray of the lumbar spine continues to show degenerative disc disease and some arthritis related changes. There is no fracture noted. Please rest her back is much as possible. Use Ultram every 6 hours for pain, use Flexeril 2 times daily for spasm. These 2 medications may cause drowsiness. Please use 3 ibuprofen 4 times daily with a meal. Back Pain, Adult Back pain is very common. The pain often gets better over time. The cause of back pain is usually not dangerous. Most people can learn to manage their back pain on their own.  HOME CARE   Stay active. Start with short walks on flat ground if you can. Try to walk farther each day.  Do not sit, drive, or stand in one place for more than 30 minutes. Do not stay in bed.  Do not avoid exercise or work. Activity can help your back heal faster.  Be careful when you bend or lift an object. Bend at your knees, keep the object close to you, and do not twist.  Sleep on a firm mattress. Lie on your side, and bend your knees. If you lie on your back, put a pillow under your knees.  Only take medicines as told by your doctor.  Put ice on the injured area.  Put ice in a plastic bag.  Place a towel between your skin and the bag.  Leave the ice on for 15-20 minutes, 03-04 times a day for the first 2 to 3 days. After that, you can switch between ice and heat packs.  Ask your doctor about back exercises or massage.  Avoid feeling anxious or stressed. Find good ways to deal with stress, such as exercise. GET HELP RIGHT AWAY IF:   Your pain does not go away with rest or medicine.  Your pain does not go away in 1 week.  You have new problems.  You do not feel well.  The pain spreads into your legs.  You cannot control when you poop (bowel movement) or pee (urinate).  Your arms or legs feel weak or lose feeling (numbness).  You feel sick to your stomach (nauseous) or throw up  (vomit).  You have belly (abdominal) pain.  You feel like you may pass out (faint). MAKE SURE YOU:   Understand these instructions.  Will watch your condition.  Will get help right away if you are not doing well or get worse. Document Released: 03/04/2008 Document Revised: 12/09/2011 Document Reviewed: 01/18/2014 [facility] Patient Information 2015 [place]Care, [place]. This information is not intended to replace advice given to you by your health care provider. Make sure you discuss any questions you have with your health care provider.

## 2014-05-06 NOTE — ED Notes (Signed)
Pt transported to xray via stretcher

## 2014-06-20 ENCOUNTER — Emergency Department (HOSPITAL_COMMUNITY)
Admission: EM | Admit: 2014-06-20 | Discharge: 2014-06-20 | Disposition: A | Discharge status: Home / Self Care | Payer: Self-pay | Attending: Emergency Medicine | Admitting: Emergency Medicine

## 2014-06-20 ENCOUNTER — Emergency Department (HOSPITAL_COMMUNITY): Payer: Self-pay

## 2014-06-20 ENCOUNTER — Encounter (HOSPITAL_COMMUNITY): Payer: Self-pay | Admitting: Emergency Medicine

## 2014-06-20 DIAGNOSIS — F410 Panic disorder [episodic paroxysmal anxiety] without agoraphobia: Secondary | ICD-10-CM | POA: Insufficient documentation

## 2014-06-20 DIAGNOSIS — I728 Aneurysm of other specified arteries: Secondary | ICD-10-CM | POA: Insufficient documentation

## 2014-06-20 DIAGNOSIS — R112 Nausea with vomiting, unspecified: Secondary | ICD-10-CM | POA: Insufficient documentation

## 2014-06-20 DIAGNOSIS — R51 Headache: Secondary | ICD-10-CM | POA: Insufficient documentation

## 2014-06-20 DIAGNOSIS — N10 Acute tubulo-interstitial nephritis: Secondary | ICD-10-CM | POA: Insufficient documentation

## 2014-06-20 DIAGNOSIS — Z91040 Latex allergy status: Secondary | ICD-10-CM | POA: Insufficient documentation

## 2014-06-20 DIAGNOSIS — Z79899 Other long term (current) drug therapy: Secondary | ICD-10-CM | POA: Insufficient documentation

## 2014-06-20 DIAGNOSIS — Z9089 Acquired absence of other organs: Secondary | ICD-10-CM | POA: Insufficient documentation

## 2014-06-20 LAB — POC URINE PREG, ED: Preg Test, Ur: NEGATIVE

## 2014-06-20 LAB — CBC WITH DIFFERENTIAL/PLATELET
BASOS ABS: 0.1 10*3/uL (ref 0.0–0.1)
Basophils Relative: 0 % (ref 0–1)
EOS PCT: 0 % (ref 0–5)
Eosinophils Absolute: 0.1 10*3/uL (ref 0.0–0.7)
HCT: 40.1 % (ref 36.0–46.0)
Hemoglobin: 13.8 g/dL (ref 12.0–15.0)
LYMPHS ABS: 1.1 10*3/uL (ref 0.7–4.0)
LYMPHS PCT: 4 % — AB (ref 12–46)
MCH: 28.8 pg (ref 26.0–34.0)
MCHC: 34.4 g/dL (ref 30.0–36.0)
MCV: 83.5 fL (ref 78.0–100.0)
Monocytes Absolute: 1.7 10*3/uL — ABNORMAL HIGH (ref 0.1–1.0)
Monocytes Relative: 7 % (ref 3–12)
NEUTROS ABS: 23.3 10*3/uL — AB (ref 1.7–7.7)
Neutrophils Relative %: 89 % — ABNORMAL HIGH (ref 43–77)
PLATELETS: 310 10*3/uL (ref 150–400)
RBC: 4.8 MIL/uL (ref 3.87–5.11)
RDW: 14.6 % (ref 11.5–15.5)
WBC: 26.2 10*3/uL — AB (ref 4.0–10.5)

## 2014-06-20 LAB — URINALYSIS, ROUTINE W REFLEX MICROSCOPIC
Glucose, UA: NEGATIVE mg/dL
Ketones, ur: NEGATIVE mg/dL
Nitrite: POSITIVE — AB
Protein, ur: 30 mg/dL — AB
SPECIFIC GRAVITY, URINE: 1.015 (ref 1.005–1.030)
UROBILINOGEN UA: 0.2 mg/dL (ref 0.0–1.0)
pH: 6 (ref 5.0–8.0)

## 2014-06-20 LAB — BASIC METABOLIC PANEL
ANION GAP: 16 — AB (ref 5–15)
BUN: 11 mg/dL (ref 6–23)
CO2: 21 mEq/L (ref 19–32)
Calcium: 8.7 mg/dL (ref 8.4–10.5)
Chloride: 100 mEq/L (ref 96–112)
Creatinine, Ser: 0.89 mg/dL (ref 0.50–1.10)
GFR calc Af Amer: >90 mL/min (ref >90)
GFR, EST NON AFRICAN AMERICAN: 84 mL/min — AB (ref >90)
GLUCOSE: 121 mg/dL — AB (ref 70–99)
Potassium: 3.8 mEq/L (ref 3.7–5.3)
SODIUM: 137 meq/L (ref 137–147)

## 2014-06-20 LAB — URINE MICROSCOPIC-ADD ON

## 2014-06-20 MED ORDER — MORPHINE SULFATE 2 MG/ML IJ SOLN
2.0000 mg | Freq: Once | INTRAMUSCULAR | Status: AC
  Administered 2014-06-20: 2 mg via INTRAVENOUS
  Filled 2014-06-20: qty 1

## 2014-06-20 MED ORDER — ONDANSETRON HCL 4 MG/2ML IJ SOLN
4.0000 mg | Freq: Once | INTRAMUSCULAR | Status: AC
  Administered 2014-06-20: 4 mg via INTRAVENOUS
  Filled 2014-06-20: qty 2

## 2014-06-20 MED ORDER — DEXTROSE 5 % IV SOLN
1.0000 g | Freq: Once | INTRAVENOUS | Status: AC
  Administered 2014-06-20: 1 g via INTRAVENOUS
  Filled 2014-06-20: qty 10

## 2014-06-20 MED ORDER — ACETAMINOPHEN 325 MG PO TABS
650.0000 mg | ORAL_TABLET | Freq: Once | ORAL | Status: AC
  Administered 2014-06-20: 650 mg via ORAL
  Filled 2014-06-20: qty 2

## 2014-06-20 MED ORDER — CEPHALEXIN 500 MG PO CAPS: 500.0000 mg | ORAL_CAPSULE | Freq: Four times a day (QID) | ORAL | Status: DC

## 2014-06-20 MED ORDER — OXYCODONE-ACETAMINOPHEN 5-325 MG PO TABS: 2.0000 | ORAL_TABLET | ORAL | Status: DC | PRN

## 2014-06-20 MED ORDER — SODIUM CHLORIDE 0.9 % IV BOLUS (SEPSIS)
1000.0000 mL | Freq: Once | INTRAVENOUS | Status: AC
  Administered 2014-06-20: 1000 mL via INTRAVENOUS

## 2014-06-20 NOTE — ED Notes (Signed)
Pt has multiple open round scratches to arms. No drainage from any of the "places". Pt states the wounds are "scratches that just turn into that". Wounds are round 2-31mm  To 1cm in diameter.

## 2014-06-20 NOTE — ED Notes (Signed)
Chills, fever, headache, nausea, and vomiting per pt. Took ibuprofen  per pt.

## 2014-06-20 NOTE — ED Notes (Signed)
Pt given follow up care and discharge instructions with prescriptions. No concerns voiced.

## 2014-06-20 NOTE — Discharge Instructions (Signed)
Pyelonephritis, Adult °Pyelonephritis is a kidney infection. In general, there are 2 main types of pyelonephritis: °· Infections that come on quickly without any warning (acute pyelonephritis). °· Infections that persist for a long period of time (chronic pyelonephritis). °CAUSES  °Two main causes of pyelonephritis are: °· Bacteria traveling from the bladder to the kidney. This is a problem especially in pregnant women. The urine in the bladder can become filled with bacteria from multiple causes, including: °¨ Inflammation of the prostate gland (prostatitis). °¨ Sexual intercourse in females. °¨ Bladder infection (cystitis). °· Bacteria traveling from the bloodstream to the tissue part of the kidney. °Problems that may increase your risk of getting a kidney infection include: °· Diabetes. °· Kidney stones or bladder stones. °· Cancer. °· Catheters placed in the bladder. °· Other abnormalities of the kidney or ureter. °SYMPTOMS  °· Abdominal pain. °· Pain in the side or flank area. °· Fever. °· Chills. °· Upset stomach. °· Blood in the urine (dark urine). °· Frequent urination. °· Strong or persistent urge to urinate. °· Burning or stinging when urinating. °DIAGNOSIS  °Your caregiver may diagnose your kidney infection based on your symptoms. A urine sample may also be taken. °TREATMENT  °In general, treatment depends on how severe the infection is.  °· If the infection is mild and caught early, your caregiver may treat you with oral antibiotics and send you home. °· If the infection is more severe, the bacteria may have gotten into the bloodstream. This will require intravenous (IV) antibiotics and a hospital stay. Symptoms may include: °¨ High fever. °¨ Severe flank pain. °¨ Shaking chills. °· Even after a hospital stay, your caregiver may require you to be on oral antibiotics for a period of time. °· Other treatments may be required depending upon the cause of the infection. °HOME CARE INSTRUCTIONS  °· Take your  antibiotics as directed. Finish them even if you start to feel better. °· Make an appointment to have your urine checked to make sure the infection is gone. °· Drink enough fluids to keep your urine clear or pale yellow. °· Take medicines for the bladder if you have urgency and frequency of urination as directed by your caregiver. °SEEK IMMEDIATE MEDICAL CARE IF:  °· You have a fever or persistent symptoms for more than 2-3 days. °· You have a fever and your symptoms suddenly get worse. °· You are unable to take your antibiotics or fluids. °· You develop shaking chills. °· You experience extreme weakness or fainting. °· There is no improvement after 2 days of treatment. °MAKE SURE YOU: °· Understand these instructions. °· Will watch your condition. °· Will get help right away if you are not doing well or get worse. °Document Released: 09/16/2005 Document Revised: 03/17/2012 Document Reviewed: 02/20/2011 °ExitCare® Patient Information ©2015 [employer]. This information is not intended to replace advice given to you by your health care provider. Make sure you discuss any questions you have with your health care provider. ° °

## 2014-06-20 NOTE — ED Provider Notes (Signed)
CSN: [id number]     Arrival date & time 06/20/14  4098 History   First MD Initiated Contact with Patient 06/20/14 0346     Chief Complaint  Patient presents with  . Emesis     Patient is a 33 y.o. female presenting with fever. The history is provided by the patient.  Fever Severity:  Moderate Onset quality:  Gradual Duration:  1 day Timing:  Constant Progression:  Worsening Chronicity:  New Relieved by:  Nothing Worsened by:  Nothing tried Associated symptoms: dysuria, headaches, myalgias, nausea and vomiting   Associated symptoms: no diarrhea   Associated symptoms comment:  Left flank pain  Pt reports for past day she has had fever/myalgias/vomiting/left flank pain and dysuria She reports she was started on bactrim last week (4 days of treatment) by her PCP.  At that time she only had dysuria.      Past Medical History  Diagnosis Date  . Panic attacks    Past Surgical History  Procedure Laterality Date  . Cholecystectomy     Family History  Problem Relation Age of Onset  . Diabetes Mother   . Hypertension Father   . Diabetes Father    History  Substance Use Topics  . Smoking status: Never Smoker   . Smokeless tobacco: Not on file  . Alcohol Use: No   OB History   Grav Para Term Preterm Abortions TAB SAB Ect Mult Living                 Review of Systems  Constitutional: Positive for fever.  Gastrointestinal: Positive for nausea and vomiting. Negative for diarrhea.  Genitourinary: Positive for dysuria. Negative for vaginal bleeding and vaginal discharge.  Musculoskeletal: Positive for myalgias.  Neurological: Positive for headaches.  All other systems reviewed and are negative.     Allergies  Latex and Zithromax  Home Medications   Prior to Admission medications   Medication Sig Start Date End Date Taking? Authorizing Provider  citalopram (CELEXA) 20 MG tablet Take 20 mg by mouth daily.   Yes Historical Provider, MD  norethindrone-ethinyl  estradiol (BREVICON, 28,) 0.5-35 MG-MCG tablet Take 1 tablet by mouth at bedtime.    Yes Historical Provider, MD  LORazepam (ATIVAN) 1 MG tablet Take 1 tablet (1 mg total) by mouth 3 (three) times daily as needed for anxiety. 05/18/13   Vanetta Mulders, MD   BP 127/65  Pulse 115  Temp(Src) 102.7 F (39.3 C) (Oral)  Resp 28  Ht 5' 1.5" (1.562 m)  Wt 270 lb (122.471 kg)  BMI 50.20 kg/m2  SpO2 95%  LMP 06/15/2014 Physical Exam CONSTITUTIONAL: Well developed/well nourished, she is diaphoretic HEAD: Normocephalic/atraumatic EYES: EOMI/PERRL ENMT: Mucous membranes moist, uvula midline, no erythema or exudates noted NECK: supple no meningeal signs SPINE:entire spine nontender CV: S1/S2 noted, no murmurs/rubs/gallops noted LUNGS: Lungs are clear to auscultation bilaterally, no apparent distress ABDOMEN: soft, nontender, no rebound or guarding. She is obese JX:BJYN cva tenderness NEURO: Pt is awake/alert, moves all extremitiesx4 EXTREMITIES: pulses normal, full ROM SKIN: warm, color normal PSYCH: no abnormalities of mood noted  ED Course  Procedures  5:02 AM Pt with pyelonephritis She has already been on bactrim for several days without improvement Will treat with rocephin and reassess 5:17 AM Pt reporting continued left flank pain, reports fees like " a knife in my back" She has hematuria without vaginal bleeding We discussed possible associated ureteral stone She agrees to have CT imaging to evaluate for ureteral stone 6:38 AM  Ct negative for acute ureteral stone Incidental findings of splenic artery aneurysm noted - pt informed of need for surveillance Vitals improved She has not had any vomiting She feels comfortable for d/c home We discussed strict return precautions BP 121/64  Pulse 98  Temp(Src) 99.3 F (37.4 C) (Oral)  Resp 20  Ht 5' 1.5" (1.562 m)  Wt 270 lb (122.471 kg)  BMI 50.20 kg/m2  SpO2 97%  LMP 06/15/2014   Labs Review Labs Reviewed  URINALYSIS,  ROUTINE W REFLEX MICROSCOPIC - Abnormal; Notable for the following:    APPearance HAZY (*)    Hgb urine dipstick MODERATE (*)    Bilirubin Urine SMALL (*)    Protein, ur 30 (*)    Nitrite POSITIVE (*)    Leukocytes, UA LARGE (*)    All other components within normal limits  BASIC METABOLIC PANEL - Abnormal; Notable for the following:    Glucose, Bld 121 (*)    GFR calc non Af Amer 84 (*)    Anion gap 16 (*)    All other components within normal limits  CBC WITH DIFFERENTIAL - Abnormal; Notable for the following:    WBC 26.2 (*)    Neutrophils Relative % 89 (*)    Neutro Abs 23.3 (*)    Lymphocytes Relative 4 (*)    Monocytes Absolute 1.7 (*)    All other components within normal limits  URINE MICROSCOPIC-ADD ON - Abnormal; Notable for the following:    Squamous Epithelial / LPF FEW (*)    Bacteria, UA MANY (*)    All other components within normal limits  URINE CULTURE  POC URINE PREG, ED    Imaging Review Ct Abdomen Pelvis Wo Contrast  06/20/2014   CLINICAL DATA:  Left flank pain for 1 day.  EXAM: CT ABDOMEN AND PELVIS WITHOUT CONTRAST  TECHNIQUE: Multidetector CT imaging of the abdomen and pelvis was performed following the standard protocol without IV contrast.  COMPARISON:  12/30/2012  FINDINGS: BODY WALL: Unremarkable.  LOWER CHEST: Unremarkable.  ABDOMEN/PELVIS:  Liver: No focal abnormality.  Biliary: Cholecystectomy.  Pancreas: Unremarkable.  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 No hemorrhage or calcification.  Adrenals: Unremarkable.  Kidneys and ureters: There is haziness of fat around the left kidney, including the hilum. Given leukocytosis and urinalysis, appearance consistent with pyelonephritis. No urolithiasis identified.  Bladder: Unremarkable.  Reproductive: Unremarkable.  Bowel: No obstruction. Negative appendix.  Retroperitoneum: No mass or adenopathy.  Peritoneum: No ascites or pneumoperitoneum.  Vascular: No acute abnormality.   OSSEOUS: Anterior superior L4 limbus vertebra. 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  IMPRESSION: 1. Subtle left pyelonephritis.  No hydronephrosis. 2. 13 mm splenic artery aneurysm. Especially if pregnancy is planned, surveillance imaging recommended.   Electronically Signed   By: Tiburcio Pea M.D.   On: 06/20/2014 06:04      MDM   Final diagnoses:  Acute pyelonephritis  Splenic artery aneurysm    Nursing notes including past medical history and social history reviewed and considered in documentation Labs/vital reviewed and considered     Joya Gaskins, MD 06/20/14 [phone]

## 2014-06-22 LAB — URINE CULTURE

## 2014-06-23 ENCOUNTER — Telehealth (HOSPITAL_COMMUNITY): Payer: Self-pay

## 2014-06-23 NOTE — ED Notes (Signed)
Post ED Visit - Positive Culture Follow-up  Culture report reviewed by antimicrobial stewardship pharmacist:  Wes Dulaney, Pharm.D., BCPS  Celedonio Miyamoto, Pharm.D., BCPS  Georgina Pillion, Pharm.D., BCPS  [place], [address].D., BCPS, AAHIVP  Estella Husk, Pharm.D., BCPS, AAHIVP  Carly Sabat, Pharm.D.  Enzo Bi, [address].D.  Positive urine culture Treated with cephalexin, organism sensitive to the same and no further patient follow-up is required at this time.  Ashley Jacobs 06/23/2014, 11:22 AM

## 2014-06-29 ENCOUNTER — Emergency Department (HOSPITAL_COMMUNITY): Payer: Self-pay

## 2014-06-29 ENCOUNTER — Emergency Department (HOSPITAL_COMMUNITY)
Admission: EM | Admit: 2014-06-29 | Discharge: 2014-06-29 | Disposition: A | Discharge status: Home / Self Care | Payer: Self-pay | Attending: Emergency Medicine | Admitting: Emergency Medicine

## 2014-06-29 ENCOUNTER — Encounter (HOSPITAL_COMMUNITY): Payer: Self-pay | Admitting: Emergency Medicine

## 2014-06-29 DIAGNOSIS — Z91040 Latex allergy status: Secondary | ICD-10-CM | POA: Insufficient documentation

## 2014-06-29 DIAGNOSIS — S0990XA Unspecified injury of head, initial encounter: Secondary | ICD-10-CM | POA: Insufficient documentation

## 2014-06-29 DIAGNOSIS — S00531A Contusion of lip, initial encounter: Secondary | ICD-10-CM

## 2014-06-29 DIAGNOSIS — Y929 Unspecified place or not applicable: Secondary | ICD-10-CM | POA: Insufficient documentation

## 2014-06-29 DIAGNOSIS — Z792 Long term (current) use of antibiotics: Secondary | ICD-10-CM | POA: Insufficient documentation

## 2014-06-29 DIAGNOSIS — S0003XA Contusion of scalp, initial encounter: Secondary | ICD-10-CM | POA: Insufficient documentation

## 2014-06-29 DIAGNOSIS — E669 Obesity, unspecified: Secondary | ICD-10-CM | POA: Insufficient documentation

## 2014-06-29 DIAGNOSIS — S199XXA Unspecified injury of neck, initial encounter: Secondary | ICD-10-CM

## 2014-06-29 DIAGNOSIS — S0083XA Contusion of other part of head, initial encounter: Principal | ICD-10-CM | POA: Insufficient documentation

## 2014-06-29 DIAGNOSIS — S5010XA Contusion of unspecified forearm, initial encounter: Secondary | ICD-10-CM | POA: Insufficient documentation

## 2014-06-29 DIAGNOSIS — S1093XA Contusion of unspecified part of neck, initial encounter: Principal | ICD-10-CM

## 2014-06-29 DIAGNOSIS — W010XXA Fall on same level from slipping, tripping and stumbling without subsequent striking against object, initial encounter: Secondary | ICD-10-CM | POA: Insufficient documentation

## 2014-06-29 DIAGNOSIS — S40029A Contusion of unspecified upper arm, initial encounter: Secondary | ICD-10-CM

## 2014-06-29 DIAGNOSIS — S0993XA Unspecified injury of face, initial encounter: Secondary | ICD-10-CM | POA: Insufficient documentation

## 2014-06-29 DIAGNOSIS — Y9389 Activity, other specified: Secondary | ICD-10-CM | POA: Insufficient documentation

## 2014-06-29 DIAGNOSIS — F411 Generalized anxiety disorder: Secondary | ICD-10-CM | POA: Insufficient documentation

## 2014-06-29 DIAGNOSIS — S298XXA Other specified injuries of thorax, initial encounter: Secondary | ICD-10-CM | POA: Insufficient documentation

## 2014-06-29 MED ORDER — ONDANSETRON HCL 4 MG PO TABS
4.0000 mg | ORAL_TABLET | Freq: Once | ORAL | Status: AC
  Administered 2014-06-29: 4 mg via ORAL
  Filled 2014-06-29: qty 1

## 2014-06-29 MED ORDER — HYDROCODONE-ACETAMINOPHEN 5-325 MG PO TABS
1.0000 | ORAL_TABLET | ORAL | Status: DC | PRN
Start: 2014-06-29 — End: 2014-10-04

## 2014-06-29 MED ORDER — IBUPROFEN 800 MG PO TABS: 800.0000 mg | ORAL_TABLET | Freq: Three times a day (TID) | ORAL | Status: DC

## 2014-06-29 MED ORDER — HYDROCODONE-ACETAMINOPHEN 5-325 MG PO TABS
2.0000 | ORAL_TABLET | Freq: Once | ORAL | Status: AC
  Administered 2014-06-29: 2 via ORAL
  Filled 2014-06-29: qty 2

## 2014-06-29 NOTE — Discharge Instructions (Signed)
Your x-rays and CT scans are negative for fracture or dislocations. Please apply ice to your face. Please use ibuprofen 3 times daily with food for pain and swelling. Use Norco every 4 hours if needed for more severe pain. Norco may cause drowsiness, please use with caution. Facial or Scalp Contusion A facial or scalp contusion is a deep bruise on the face or head. Injuries to the face and head generally cause a lot of swelling, especially around the eyes. Contusions are the result of an injury that caused bleeding under the skin. The contusion may turn blue, purple, or yellow. Minor injuries will give you a painless contusion, but more severe contusions may stay painful and swollen for a few weeks.  CAUSES  A facial or scalp contusion is caused by a blunt injury or trauma to the face or head area.  SIGNS AND SYMPTOMS   Swelling of the injured area.   Discoloration of the injured area.   Tenderness, soreness, or pain in the injured area.  DIAGNOSIS  The diagnosis can be made by taking a medical history and doing a physical exam. An X-ray exam, CT scan, or MRI may be needed to determine if there are any associated injuries, such as broken bones (fractures). TREATMENT  Often, the best treatment for a facial or scalp contusion is applying cold compresses to the injured area. Over-the-counter medicines may also be recommended for pain control.  HOME CARE INSTRUCTIONS   Only take over-the-counter or prescription medicines as directed by your health care provider.   Apply ice to the injured area.   Put ice in a plastic bag.   Place a towel between your skin and the bag.   Leave the ice on for 20 minutes, 2-3 times a day.  SEEK MEDICAL CARE IF:  You have bite problems.   You have pain with chewing.   You are concerned about facial defects. SEEK IMMEDIATE MEDICAL CARE IF:  You have severe pain or a headache that is not relieved by medicine.   You have unusual sleepiness,  confusion, or personality changes.   You throw up (vomit).   You have a persistent nosebleed.   You have double vision or blurred vision.   You have fluid drainage from your nose or ear.   You have difficulty walking or using your arms or legs.  MAKE SURE YOU:   Understand these instructions.  Will watch your condition.  Will get help right away if you are not doing well or get worse. Document Released: 10/24/2004 Document Revised: 07/07/2013 Document Reviewed: 04/29/2013 [facility] Patient Information 2015 [place]Care, [place]LLC. This information is not intended to replace advice given to you by your health care provider. Make sure you discuss any questions you have with your health care provider.  Contusion A contusion is a deep bruise. Contusions happen when an injury causes bleeding under the skin. Signs of bruising include pain, puffiness (swelling), and discolored skin. The contusion may turn blue, purple, or yellow. HOME CARE   Put ice on the injured area.  Put ice in a plastic bag.  Place a towel between your skin and the bag.  Leave the ice on for 15-20 minutes, 03-04 times a day.  Only take medicine as told by your doctor.  Rest the injured area.  If possible, raise (elevate) the injured area to lessen puffiness. GET HELP RIGHT AWAY IF:   You have more bruising or puffiness.  You have pain that is getting worse.  Your puffiness  or pain is not helped by medicine. MAKE SURE YOU:   Understand these instructions.  Will watch your condition.  Will get help right away if you are not doing well or get worse. Document Released: 03/04/2008 Document Revised: 12/09/2011 Document Reviewed: 07/22/2011 [facility] Patient Information 2015 [address], [place]. This information is not intended to replace advice given to you by your health care provider. Make sure you discuss any questions you have with your health care provider.

## 2014-06-29 NOTE — ED Notes (Signed)
Pt c/o pain/swelling to top and bottom lips and pain to bilateral elbows after slipping and falling this am. Denies loc/hitting head/neck or back pain.

## 2014-06-29 NOTE — ED Provider Notes (Signed)
CSN: [id number]     Arrival date & time 06/29/14  6213 History   First MD Initiated Contact with Patient 06/29/14 0901     Chief Complaint  Patient presents with  . Fall     (Consider location/radiation/quality/duration/timing/severity/associated sxs/prior Treatment) HPI Comments: Pt states she was chasing her cat when she fell face first in the kitchen area and injured the top and bottom lip, as well as the right arm, and left elbow.  Patient is a 33 y.o. female presenting with fall. The history is provided by the patient.  Fall This is a new problem. The current episode started today. The problem has been gradually worsening. Associated symptoms include headaches. Pertinent negatives include no abdominal pain, arthralgias, chest pain, coughing, neck pain, numbness, vomiting or weakness. Nothing aggravates the symptoms. She has tried ice for the symptoms. The treatment provided no relief.    Past Medical History  Diagnosis Date  . Panic attacks    Past Surgical History  Procedure Laterality Date  . Cholecystectomy     Family History  Problem Relation Age of Onset  . Diabetes Mother   . Hypertension Father   . Diabetes Father    History  Substance Use Topics  . Smoking status: Never Smoker   . Smokeless tobacco: Not on file  . Alcohol Use: No   OB History   Grav Para Term Preterm Abortions TAB SAB Ect Mult Living                 Review of Systems  Constitutional: Negative for activity change.       All ROS Neg except as noted in HPI  HENT: Positive for facial swelling. Negative for nosebleeds.   Eyes: Negative for photophobia and discharge.  Respiratory: Negative for cough, shortness of breath and wheezing.   Cardiovascular: Negative for chest pain and palpitations.  Gastrointestinal: Negative for vomiting, abdominal pain and blood in stool.  Genitourinary: Negative for dysuria, frequency and hematuria.  Musculoskeletal: Negative for arthralgias, back pain and neck  pain.  Skin: Positive for wound.  Neurological: Positive for headaches. Negative for dizziness, seizures, speech difficulty, weakness and numbness.  Psychiatric/Behavioral: Negative for hallucinations and confusion. The patient is nervous/anxious.       Allergies  Latex and Zithromax  Home Medications   Prior to Admission medications   Medication Sig Start Date End Date Taking? Authorizing Provider  cephALEXin (KEFLEX) 500 MG capsule Take 1 capsule (500 mg total) by mouth 4 (four) times daily. 06/20/14   Joya Gaskins, MD  citalopram (CELEXA) 20 MG tablet Take 20 mg by mouth daily.    Historical Provider, MD  LORazepam (ATIVAN) 1 MG tablet Take 1 tablet (1 mg total) by mouth 3 (three) times daily as needed for anxiety. 05/18/13   Vanetta Mulders, MD  norethindrone-ethinyl estradiol (BREVICON, 28,) 0.5-35 MG-MCG tablet Take 1 tablet by mouth at bedtime.     Historical Provider, MD  oxyCODONE-acetaminophen (PERCOCET/ROXICET) 5-325 MG per tablet Take 2 tablets by mouth every 4 (four) hours as needed for severe pain. 06/20/14   Joya Gaskins, MD   BP 136/60  Pulse 87  Temp(Src) 98.2 F (36.8 C)  Resp 20  Ht 5\' 2"  (1.575 m)  Wt 270 lb (122.471 kg)  BMI 49.37 kg/m2  SpO2 97%  LMP 06/15/2014 Physical Exam  Nursing note and vitals reviewed. Constitutional: She is oriented to person, place, and time. She appears well-developed and well-nourished.  Non-toxic appearance.  Pt is obese  HENT:  Head: Normocephalic.  Right Ear: Tympanic membrane and external ear normal.  Left Ear: Tympanic membrane and external ear normal.  Upper and lower lip swollen. The airway is patent. There is no trauma to the tongue or to the teeth. No swelling under the tongue. There is evidence of gum disease.  There is soreness of the nose and the left orbit. There is no deformity palpated of the mandible. No pain or bruising behind the ears.  Eyes: EOM and lids are normal. Pupils are equal, round, and  reactive to light.  Neck: Normal range of motion. Neck supple. Carotid bruit is not present.  Cardiovascular: Normal rate, regular rhythm, normal heart sounds, intact distal pulses and normal pulses.   Pulmonary/Chest: Breath sounds normal. No respiratory distress.  There is mild chest wall soreness. There is no bruising of the chest, breast, or xiphoid area. The husband was present during the examination.  Abdominal: Soft. Bowel sounds are normal. There is no tenderness. There is no guarding.  Musculoskeletal: Normal range of motion.  There is soreness of the right forearm and the olecranon of the elbow. There is pain with attempted range of motion of the left elbow, no effusion appreciated. There is full range of motion of right and left shoulders, right and left wrist, right and left fingers.  Lymphadenopathy:       Head (right side): No submandibular adenopathy present.       Head (left side): No submandibular adenopathy present.    She has no cervical adenopathy.  Neurological: She is alert and oriented to person, place, and time. She has normal strength. No cranial nerve deficit or sensory deficit.  Skin: Skin is warm and dry.  There are multiple scab and some denuded lesion areas of multiple sites of the upper extremity, abdomen, and lower extremity.  Psychiatric: She has a normal mood and affect. Her speech is normal.    ED Course  Procedures (including critical care time) Labs Review Labs Reviewed - No data to display  Imaging Review No results found.   EKG Interpretation None      MDM  Vital signs stable. xrays of the forearm and elbow are negative for fx or dislocation. CT of the facial bones is negative for fracture or dislocation. Rx for ibuprofen and norco given to the patient. Ice pack given and strongly encouraged.   Final diagnoses:  Contusion, lip, initial encounter  Contusion, upper extremity, unspecified laterality, initial encounter    **I have reviewed  nursing notes, vital signs, and all appropriate lab and imaging results for this patient.Kathie Dike*    Audray Rumore M Takya Vandivier, PA-C 06/29/14 [phone]

## 2014-06-30 NOTE — ED Provider Notes (Signed)
Medical screening examination/treatment/procedure(s) were performed by non-physician practitioner and as supervising physician I was immediately available for consultation/collaboration.  Gerhard Munchobert Quanta Roher, MD 06/30/14 [phone]

## 2014-08-23 ENCOUNTER — Emergency Department (HOSPITAL_COMMUNITY): Payer: Self-pay

## 2014-08-23 ENCOUNTER — Emergency Department (HOSPITAL_COMMUNITY)
Admission: EM | Admit: 2014-08-23 | Discharge: 2014-08-24 | Disposition: A | Discharge status: Home / Self Care | Payer: Self-pay | Attending: Emergency Medicine | Admitting: Emergency Medicine

## 2014-08-23 ENCOUNTER — Encounter (HOSPITAL_COMMUNITY): Payer: Self-pay | Admitting: Emergency Medicine

## 2014-08-23 DIAGNOSIS — R062 Wheezing: Secondary | ICD-10-CM

## 2014-08-23 DIAGNOSIS — Z79899 Other long term (current) drug therapy: Secondary | ICD-10-CM | POA: Insufficient documentation

## 2014-08-23 DIAGNOSIS — J209 Acute bronchitis, unspecified: Secondary | ICD-10-CM | POA: Insufficient documentation

## 2014-08-23 DIAGNOSIS — F410 Panic disorder [episodic paroxysmal anxiety] without agoraphobia: Secondary | ICD-10-CM | POA: Insufficient documentation

## 2014-08-23 DIAGNOSIS — R1110 Vomiting, unspecified: Secondary | ICD-10-CM | POA: Insufficient documentation

## 2014-08-23 DIAGNOSIS — Z792 Long term (current) use of antibiotics: Secondary | ICD-10-CM | POA: Insufficient documentation

## 2014-08-23 DIAGNOSIS — Z791 Long term (current) use of non-steroidal anti-inflammatories (NSAID): Secondary | ICD-10-CM | POA: Insufficient documentation

## 2014-08-23 MED ORDER — BENZONATATE 100 MG PO CAPS
200.0000 mg | ORAL_CAPSULE | Freq: Once | ORAL | Status: AC
  Administered 2014-08-23: 200 mg via ORAL
  Filled 2014-08-23: qty 2

## 2014-08-23 MED ORDER — ALBUTEROL SULFATE (2.5 MG/3ML) 0.083% IN NEBU
2.5000 mg | INHALATION_SOLUTION | Freq: Once | RESPIRATORY_TRACT | Status: AC
  Administered 2014-08-23: 2.5 mg via RESPIRATORY_TRACT
  Filled 2014-08-23: qty 3

## 2014-08-23 MED ORDER — IPRATROPIUM-ALBUTEROL 0.5-2.5 (3) MG/3ML IN SOLN
3.0000 mL | Freq: Once | RESPIRATORY_TRACT | Status: AC
  Administered 2014-08-23: 3 mL via RESPIRATORY_TRACT
  Filled 2014-08-23: qty 3

## 2014-08-23 MED ORDER — PREDNISONE 50 MG PO TABS
60.0000 mg | ORAL_TABLET | Freq: Once | ORAL | Status: AC
  Administered 2014-08-23: 60 mg via ORAL
  Filled 2014-08-23 (×2): qty 1

## 2014-08-23 NOTE — ED Notes (Signed)
Pt c/o coughing and wheezing x one week and pt states she started vomiting today.

## 2014-08-24 MED ORDER — PROMETHAZINE-CODEINE 6.25-10 MG/5ML PO SYRP
5.0000 mL | ORAL_SOLUTION | Freq: Once | ORAL | Status: AC
  Administered 2014-08-24: 5 mL via ORAL
  Filled 2014-08-24: qty 5

## 2014-08-24 MED ORDER — PREDNISONE 10 MG PO TABS: ORAL_TABLET | ORAL | Status: DC

## 2014-08-24 MED ORDER — ALBUTEROL SULFATE HFA 108 (90 BASE) MCG/ACT IN AERS
2.0000 | INHALATION_SPRAY | RESPIRATORY_TRACT | Status: DC | PRN
  Administered 2014-08-24: 2 via RESPIRATORY_TRACT
  Filled 2014-08-24: qty 6.7

## 2014-08-24 MED ORDER — PROMETHAZINE-CODEINE 6.25-10 MG/5ML PO SYRP: 5.0000 mL | ORAL_SOLUTION | ORAL | Status: DC | PRN

## 2014-08-24 NOTE — Discharge Instructions (Signed)

## 2014-08-24 NOTE — ED Provider Notes (Signed)
CSN: [id number]     Arrival date & time 08/23/14  2131 History   First MD Initiated [id number] with Patient 08/23/14 2242     Chief Complaint  Patient presents with  . Cough     (Consider location/radiation/quality/duration/timing/severity/associated sxs/prior Treatment) The history is provided by the patient.   Brandy Hoover is a 33 y.o. female presenting with a one week history of nonproductive cough and wheezing with shortness of breath which worsens at night.  Additionally, she describes nasal congestion with clear rhinorrhea and coughing up clear sputum.  Additionally she reports mild sore throat, and new onset of post tussive emesis just starting today.  Symptoms due to not include chest pain, nausea or diarrhea.  The patient has used her albuterol inhaler given during her last episode of bronchitis with no significant improvement in symptoms but has run out of this medicine.      Past Medical History  Diagnosis Date  . Panic attacks    Past Surgical History  Procedure Laterality Date  . Cholecystectomy     Family History  Problem Relation Age of Onset  . Diabetes Mother   . Hypertension Father   . Diabetes Father    History  Substance Use Topics  . Smoking status: Never Smoker   . Smokeless tobacco: Not on file  . Alcohol Use: No   OB History    No data available     Review of Systems  Constitutional: Negative for fever and chills.  HENT: Positive for congestion, rhinorrhea and sore throat. Negative for ear pain, sinus pressure, trouble swallowing and voice change.   Eyes: Negative for discharge.  Respiratory: Positive for cough and wheezing. Negative for shortness of breath and stridor.   Cardiovascular: Negative for chest pain.  Gastrointestinal: Positive for vomiting. Negative for abdominal pain.  Genitourinary: Negative.       Allergies  Latex and Zithromax  Home Medications   Prior to Admission medications   Medication Sig Start Date End Date  Taking? Authorizing Provider  cephALEXin (KEFLEX) 500 MG capsule Take 1 capsule (500 mg total) by mouth 4 (four) times daily. 06/20/14   Joya Gaskinsonald W Wickline, MD  citalopram (CELEXA) 20 MG tablet Take 20 mg by mouth daily.    Historical Provider, MD  HYDROcodone-acetaminophen (NORCO/VICODIN) 5-325 MG per tablet Take 1 tablet by mouth every 4 (four) hours as needed. 06/29/14   Kathie DikeHobson M Bryant, PA-C  ibuprofen (ADVIL,MOTRIN) 800 MG tablet Take 1 tablet (800 mg total) by mouth 3 (three) times daily. 06/29/14   Kathie DikeHobson M Bryant, PA-C  norethindrone-ethinyl estradiol (BREVICON, 28,) 0.5-35 MG-MCG tablet Take 1 tablet by mouth at bedtime.     Historical Provider, MD  oxyCODONE-acetaminophen (PERCOCET/ROXICET) 5-325 MG per tablet Take 2 tablets by mouth every 4 (four) hours as needed for severe pain. 06/20/14   Joya Gaskinsonald W Wickline, MD  predniSONE (DELTASONE) 10 MG tablet 6, 5, 4, 3, 2 then 1 tablet by mouth daily for 6 days total. 08/24/14   Burgess AmorJulie Tytus Strahle, PA-C  promethazine-codeine (PHENERGAN WITH CODEINE) 6.25-10 MG/5ML syrup Take 5 mLs by mouth every 4 (four) hours as needed for cough. 08/24/14   Burgess AmorJulie Kunal Levario, PA-C   BP 129/78 mmHg  Pulse 101  Temp(Src) 98.3 F (36.8 C) (Oral)  Resp 22  Ht 5' 1.5" (1.562 m)  Wt 275 lb (124.739 kg)  BMI 51.13 kg/m2  SpO2 99%  LMP 07/23/2014 (Approximate) Physical Exam  Constitutional: She is oriented to person, place, and time. She appears well-developed  and well-nourished.  HENT:  Head: Normocephalic and atraumatic.  Right Ear: Tympanic membrane and ear canal normal.  Left Ear: Tympanic membrane and ear canal normal.  Nose: Mucosal edema and rhinorrhea present.  Mouth/Throat: Uvula is midline, oropharynx is clear and moist and mucous membranes are normal. No oropharyngeal exudate, posterior oropharyngeal edema, posterior oropharyngeal erythema or tonsillar abscesses.  Eyes: Conjunctivae are normal.  Cardiovascular: Normal rate and normal heart sounds.   Pulmonary/Chest:  Effort normal. No respiratory distress. She has decreased breath sounds. She has wheezes. She has no rhonchi. She has no rales.  Prolonged respirations, expiratory wheeze throughout all lung fields.  Abdominal: Soft. There is no tenderness.  Musculoskeletal: Normal range of motion.  Neurological: She is alert and oriented to person, place, and time.  Skin: Skin is warm and dry. No rash noted.  Psychiatric: She has a normal mood and affect.    ED Course  Procedures (including critical care time) Labs Review Labs Reviewed - No data to display  Imaging Review Dg Chest 2 View  08/23/2014   CLINICAL DATA:  Dry cough for 1 week  EXAM: CHEST  2 VIEW  COMPARISON:  05/18/2013  FINDINGS: The heart size and mediastinal contours are within normal limits. Both lungs are clear. The visualized skeletal structures are unremarkable.  IMPRESSION: No active cardiopulmonary disease.   Electronically Signed   By: Signa Kellaylor  Stroud M.D.   On: 08/23/2014 23:58     EKG Interpretation None      Medications  ipratropium-albuterol (DUONEB) 0.5-2.5 (3) MG/3ML nebulizer solution 3 mL (3 mLs Nebulization Given 08/23/14 2330)  albuterol (PROVENTIL) (2.5 MG/3ML) 0.083% nebulizer solution 2.5 mg (2.5 mg Nebulization Given 08/23/14 2330)  predniSONE (DELTASONE) tablet 60 mg (60 mg Oral Given 08/23/14 2310)  benzonatate (TESSALON) capsule 200 mg (200 mg Oral Given 08/23/14 2310)  promethazine-codeine (PHENERGAN with CODEINE) 6.25-10 MG/5ML syrup 5 mL (5 mLs Oral Given 08/24/14 0105)    MDM   Final diagnoses:  Wheezing  Bronchitis, acute, with bronchospasm    Pt was given albuterol and atrovent neb along with prednisone 60 mg PO with improvement in wheeze, still persistent cough.  Tessalong and phenergan/codeine syrup given.  Prescription for same given.  New albuterol mdi given.  Advised rest, increased fluid intake,  Recheck for any worsened sx.  Xray stable without pneumonia or other resp findings.  The  patient appears reasonably screened and/or stabilized for discharge and I doubt any other medical condition or other [facility] requiring further screening, evaluation, or treatment in the ED at this time prior to discharge.     Burgess AmorJulie Deone Omahoney, PA-C 08/24/14 1609  Hanley SeamenJohn L Molpus, MD 08/26/14 2256

## 2014-10-04 ENCOUNTER — Emergency Department (HOSPITAL_COMMUNITY): Payer: Self-pay

## 2014-10-04 ENCOUNTER — Emergency Department (HOSPITAL_COMMUNITY)
Admission: EM | Admit: 2014-10-04 | Discharge: 2014-10-04 | Disposition: A | Discharge status: Home / Self Care | Payer: Self-pay | Attending: Emergency Medicine | Admitting: Emergency Medicine

## 2014-10-04 ENCOUNTER — Encounter (HOSPITAL_COMMUNITY): Payer: Self-pay | Admitting: Emergency Medicine

## 2014-10-04 DIAGNOSIS — Z79899 Other long term (current) drug therapy: Secondary | ICD-10-CM | POA: Insufficient documentation

## 2014-10-04 DIAGNOSIS — Z91040 Latex allergy status: Secondary | ICD-10-CM | POA: Insufficient documentation

## 2014-10-04 DIAGNOSIS — R1110 Vomiting, unspecified: Secondary | ICD-10-CM | POA: Insufficient documentation

## 2014-10-04 DIAGNOSIS — F410 Panic disorder [episodic paroxysmal anxiety] without agoraphobia: Secondary | ICD-10-CM | POA: Insufficient documentation

## 2014-10-04 DIAGNOSIS — E669 Obesity, unspecified: Secondary | ICD-10-CM | POA: Insufficient documentation

## 2014-10-04 DIAGNOSIS — R059 Cough, unspecified: Secondary | ICD-10-CM

## 2014-10-04 DIAGNOSIS — Z79818 Long term (current) use of other agents affecting estrogen receptors and estrogen levels: Secondary | ICD-10-CM | POA: Insufficient documentation

## 2014-10-04 DIAGNOSIS — R05 Cough: Secondary | ICD-10-CM

## 2014-10-04 DIAGNOSIS — J069 Acute upper respiratory infection, unspecified: Secondary | ICD-10-CM | POA: Insufficient documentation

## 2014-10-04 LAB — CBC WITH DIFFERENTIAL/PLATELET
Basophils Absolute: 0.1 10*3/uL (ref 0.0–0.1)
Basophils Relative: 1 % (ref 0–1)
Eosinophils Absolute: 0.1 10*3/uL (ref 0.0–0.7)
Eosinophils Relative: 1 % (ref 0–5)
HCT: 39.4 % (ref 36.0–46.0)
Hemoglobin: 13 g/dL (ref 12.0–15.0)
LYMPHS ABS: 1.6 10*3/uL (ref 0.7–4.0)
LYMPHS PCT: 16 % (ref 12–46)
MCH: 28.6 pg (ref 26.0–34.0)
MCHC: 33 g/dL (ref 30.0–36.0)
MCV: 86.6 fL (ref 78.0–100.0)
Monocytes Absolute: 0.6 10*3/uL (ref 0.1–1.0)
Monocytes Relative: 6 % (ref 3–12)
NEUTROS PCT: 76 % (ref 43–77)
Neutro Abs: 7.7 10*3/uL (ref 1.7–7.7)
Platelets: 332 10*3/uL (ref 150–400)
RBC: 4.55 MIL/uL (ref 3.87–5.11)
RDW: 14.3 % (ref 11.5–15.5)
WBC: 10.1 10*3/uL (ref 4.0–10.5)

## 2014-10-04 LAB — COMPREHENSIVE METABOLIC PANEL
ALT: 19 U/L (ref 0–35)
AST: 16 U/L (ref 0–37)
Albumin: 3.1 g/dL — ABNORMAL LOW (ref 3.5–5.2)
Alkaline Phosphatase: 83 U/L (ref 39–117)
Anion gap: 6 (ref 5–15)
BILIRUBIN TOTAL: 0.7 mg/dL (ref 0.3–1.2)
BUN: 9 mg/dL (ref 6–23)
CHLORIDE: 108 meq/L (ref 96–112)
CO2: 27 mmol/L (ref 19–32)
Calcium: 8.7 mg/dL (ref 8.4–10.5)
Creatinine, Ser: 0.68 mg/dL (ref 0.50–1.10)
GFR calc Af Amer: >90 mL/min (ref >90)
GFR calc non Af Amer: >90 mL/min (ref >90)
Glucose, Bld: 91 mg/dL (ref 70–99)
Potassium: 4.3 mmol/L (ref 3.5–5.1)
SODIUM: 141 mmol/L (ref 135–145)
Total Protein: 6.8 g/dL (ref 6.0–8.3)

## 2014-10-04 MED ORDER — BENZONATATE 100 MG PO CAPS: 100.0000 mg | ORAL_CAPSULE | Freq: Three times a day (TID) | ORAL | Status: DC

## 2014-10-04 MED ORDER — PROMETHAZINE HCL 25 MG PO TABS: 25.0000 mg | ORAL_TABLET | Freq: Four times a day (QID) | ORAL | Status: DC | PRN

## 2014-10-04 MED ORDER — SODIUM CHLORIDE 0.9 % IV SOLN
1000.0000 mL | Freq: Once | INTRAVENOUS | Status: AC
  Administered 2014-10-04: 1000 mL via INTRAVENOUS

## 2014-10-04 NOTE — ED Provider Notes (Signed)
CSN: [id number]     Arrival date & time 10/04/14  1209 History  This chart was scribed for Linwood DibblesJon Brevan Luberto, MD by Tonye RoyaltyJoshua Chen, ED Scribe. This patient was seen in room APA14/AP[facility]14 and the patient's care was started at 1:19 PM.    Chief Complaint  Patient presents with  . Cough   The history is provided by the patient. No language interpreter was used.    HPI Comments: Brandy Hoover is a 34 y.o. female who presents to the Emergency Department complaining of cough and sore throat with onset 3 days ago. She reports associated vomiting with onset this morning, wheezing, and headache. She denies smoking or history of asthma or other breathing problems. She denies any other symptoms.  Past Medical History  Diagnosis Date  . Panic attacks    Past Surgical History  Procedure Laterality Date  . Cholecystectomy     Family History  Problem Relation Age of Onset  . Diabetes Mother   . Hypertension Father   . Diabetes Father    History  Substance Use Topics  . Smoking status: Never Smoker   . Smokeless tobacco: Never Used  . Alcohol Use: No   OB History    Gravida Para Term Preterm AB TAB SAB Ectopic Multiple Living            0     Review of Systems  HENT: Positive for sore throat.   Respiratory: Positive for cough and wheezing.   Gastrointestinal: Positive for vomiting.  Neurological: Positive for headaches.  All other systems reviewed and are negative.     Allergies  Latex and Zithromax  Home Medications   Prior to Admission medications   Medication Sig Start Date End Date Taking? Authorizing Provider  citalopram (CELEXA) 20 MG tablet Take 20 mg by mouth at bedtime.    Yes Historical Provider, MD  ibuprofen (ADVIL,MOTRIN) 200 MG tablet Take 400 mg by mouth every 8 (eight) hours as needed for headache.   Yes Historical Provider, MD  norethindrone-ethinyl estradiol (BREVICON, 28,) 0.5-35 MG-MCG tablet Take 1 tablet by mouth at bedtime.    Yes Historical Provider, MD  cephALEXin  (KEFLEX) 500 MG capsule Take 1 capsule (500 mg total) by mouth 4 (four) times daily. Patient not taking: Reported on 10/04/2014 06/20/14   Joya Gaskinsonald W Wickline, MD  HYDROcodone-acetaminophen (NORCO/VICODIN) 5-325 MG per tablet Take 1 tablet by mouth every 4 (four) hours as needed. Patient not taking: Reported on 10/04/2014 06/29/14   Kathie DikeHobson M Bryant, PA-C  ibuprofen (ADVIL,MOTRIN) 800 MG tablet Take 1 tablet (800 mg total) by mouth 3 (three) times daily. Patient not taking: Reported on 10/04/2014 06/29/14   Kathie DikeHobson M Bryant, PA-C  oxyCODONE-acetaminophen (PERCOCET/ROXICET) 5-325 MG per tablet Take 2 tablets by mouth every 4 (four) hours as needed for severe pain. Patient not taking: Reported on 10/04/2014 06/20/14   Joya Gaskinsonald W Wickline, MD  predniSONE (DELTASONE) 10 MG tablet 6, 5, 4, 3, 2 then 1 tablet by mouth daily for 6 days total. Patient not taking: Reported on 10/04/2014 08/24/14   Burgess AmorJulie Idol, PA-C  promethazine-codeine (PHENERGAN WITH CODEINE) 6.25-10 MG/5ML syrup Take 5 mLs by mouth every 4 (four) hours as needed for cough. Patient not taking: Reported on 10/04/2014 08/24/14   Burgess AmorJulie Idol, PA-C   BP 137/87 mmHg  Pulse 106  Temp(Src) 98.4 F (36.9 C) (Oral)  Resp 18  Ht 5\' 1"  (1.549 m)  Wt 275 lb (124.739 kg)  BMI 51.99 kg/m2  SpO2 99%  LMP 09/04/2014 Physical Exam  Constitutional: She appears well-developed and well-nourished. No distress.  Obese   HENT:  Head: Normocephalic and atraumatic.  Right Ear: External ear normal.  Left Ear: External ear normal.  Mouth/Throat: No oropharyngeal exudate.  Eyes: Conjunctivae are normal. Right eye exhibits no discharge. Left eye exhibits no discharge. No scleral icterus.  Neck: Neck supple. No tracheal deviation present.  Cardiovascular: Normal rate, regular rhythm and intact distal pulses.   Pulmonary/Chest: Effort normal and breath sounds normal. No stridor. No respiratory distress. She has no wheezes. She has no rales.  Abdominal: Soft. Bowel sounds are  normal. She exhibits no distension. There is no tenderness. There is no rebound and no guarding.  Musculoskeletal: She exhibits no edema or tenderness.  Neurological: She is alert. She has normal strength. No cranial nerve deficit (no facial droop, extraocular movements intact, no slurred speech) or sensory deficit. She exhibits normal muscle tone. She displays no seizure activity. Coordination normal.  Skin: Skin is warm and dry. No rash noted.  Psychiatric: She has a normal mood and affect.  Nursing note and vitals reviewed.   ED Course  Procedures (including critical care time)  DIAGNOSTIC STUDIES: Oxygen Saturation is 99% on room air, normal by my interpretation.    COORDINATION OF CARE: 1:22 PM Discussed with patient that her symptoms may be due to viral illness, but will work up for other causes including chest x-ray. The patient agrees with the plan and has no further questions at this time.  Labs Review Labs Reviewed  COMPREHENSIVE METABOLIC PANEL - Abnormal; Notable for the following:    Albumin 3.1 (*)    All other components within normal limits  CBC WITH DIFFERENTIAL    Imaging Review Dg Chest 2 View  10/04/2014   CLINICAL DATA:  Cough.  EXAM: CHEST  2 VIEW  COMPARISON:  August 23, 2014.  FINDINGS: The heart size and mediastinal contours are within normal limits. Both lungs are clear. No pneumothorax or pleural effusion is noted. The visualized skeletal structures are unremarkable.  IMPRESSION: No acute cardiopulmonary abnormality seen.   Electronically Signed   By: Roque Lias M.D.   On: 10/04/2014 13:34      MDM  DX: URI  Symptoms are consistent with a simple upper respiratory infection. There is no evidence to suggest pneumonia on my exam.  I discussed supportive treatment. I encouraged followup with the primary care doctor next week if symptoms have not resolved. Warning signs and reasons to return to the emergency room were discussed  I personally performed the  services described in this documentation, which was scribed in my presence.  The recorded information has been reviewed and is accurate.   Linwood Dibbles, MD 10/04/14 1351

## 2014-10-04 NOTE — ED Notes (Signed)
MD at bedside. 

## 2014-10-04 NOTE — Discharge Instructions (Signed)
Cough, Adult ° A cough is a reflex. It helps you clear your throat and airways. A cough can help heal your body. A cough can last 2 or 3 weeks (acute) or may last more than 8 weeks (chronic). Some common causes of a cough can include an infection, allergy, or a cold. °HOME CARE °· Only take medicine as told by your doctor. °· If given, take your medicines (antibiotics) as told. Finish them even if you start to feel better. °· Use a cold steam vaporizer or humidifier in your home. This can help loosen thick spit (secretions). °· Sleep so you are almost sitting up (semi-upright). Use pillows to do this. This helps reduce coughing. °· Rest as needed. °· Stop smoking if you smoke. °GET HELP RIGHT AWAY IF: °· You have yellowish-white fluid (pus) in your thick spit. °· Your cough gets worse. °· Your medicine does not reduce coughing, and you are losing sleep. °· You cough up blood. °· You have trouble breathing. °· Your pain gets worse and medicine does not help. °· You have a fever. °MAKE SURE YOU:  °· Understand these instructions. °· Will watch your condition. °· Will get help right away if you are not doing well or get worse. °Document Released: 05/30/2011 Document Revised: 01/31/2014 Document Reviewed: 05/30/2011 °Exit[employer]® Patient Information ©2015 [employer]. This information is not intended to replace advice given to you by your health care provider. Make sure you discuss any questions you have with your health care provider. ° °Upper Respiratory Infection, Adult °An upper respiratory infection (URI) is also known as the common cold. It is often caused by a type of germ (virus). Colds are easily spread (contagious). You can pass it to others by kissing, coughing, sneezing, or drinking out of the same glass. Usually, you get better in 1 or 2 weeks.  °HOME CARE  °· Only take medicine as told by your doctor. °· Use a warm mist humidifier or breathe in steam from a hot shower. °· Drink enough water and fluids to  keep your pee (urine) clear or pale yellow. °· Get plenty of rest. °· Return to work when your temperature is back to normal or as told by your doctor. You may use a face mask and wash your hands to stop your cold from spreading. °GET HELP RIGHT AWAY IF:  °· After the first few days, you feel you are getting worse. °· You have questions about your medicine. °· You have chills, shortness of breath, or brown or red spit (mucus). °· You have yellow or brown snot (nasal discharge) or pain in the face, especially when you bend forward. °· You have a fever, puffy (swollen) neck, pain when you swallow, or white spots in the back of your throat. °· You have a bad headache, ear pain, sinus pain, or chest pain. °· You have a high-pitched whistling sound when you breathe in and out (wheezing). °· You have a lasting cough or cough up blood. °· You have sore muscles or a stiff neck. °MAKE SURE YOU:  °· Understand these instructions. °· Will watch your condition. °· Will get help right away if you are not doing well or get worse. °Document Released: 03/04/2008 Document Revised: 12/09/2011 Document Reviewed: 12/22/2013 °[employer]® Patient Information ©2015 [employer]. This information is not intended to replace advice given to you by your health care provider. Make sure you discuss any questions you have with your health care provider. ° °

## 2014-10-04 NOTE — ED Notes (Signed)
Patient c/o cough, sore throat, nausea, and vomiting. Patient reports cough and sore throat x3 days. Vomiting started today x6. Denies any diarrhea. Per patient productive cough with thick clear sputum. Denies any fevers.

## 2015-04-04 ENCOUNTER — Encounter (HOSPITAL_COMMUNITY): Payer: Self-pay

## 2015-04-04 ENCOUNTER — Emergency Department (HOSPITAL_COMMUNITY)
Admission: EM | Admit: 2015-04-04 | Discharge: 2015-04-05 | Disposition: A | Discharge status: Home / Self Care | Payer: Self-pay | Attending: Emergency Medicine | Admitting: Emergency Medicine

## 2015-04-04 DIAGNOSIS — E669 Obesity, unspecified: Secondary | ICD-10-CM | POA: Insufficient documentation

## 2015-04-04 DIAGNOSIS — Z91040 Latex allergy status: Secondary | ICD-10-CM | POA: Insufficient documentation

## 2015-04-04 DIAGNOSIS — Z79899 Other long term (current) drug therapy: Secondary | ICD-10-CM | POA: Insufficient documentation

## 2015-04-04 DIAGNOSIS — Z3202 Encounter for pregnancy test, result negative: Secondary | ICD-10-CM | POA: Insufficient documentation

## 2015-04-04 DIAGNOSIS — R112 Nausea with vomiting, unspecified: Secondary | ICD-10-CM

## 2015-04-04 DIAGNOSIS — R197 Diarrhea, unspecified: Secondary | ICD-10-CM | POA: Insufficient documentation

## 2015-04-04 DIAGNOSIS — Z9049 Acquired absence of other specified parts of digestive tract: Secondary | ICD-10-CM | POA: Insufficient documentation

## 2015-04-04 DIAGNOSIS — E860 Dehydration: Secondary | ICD-10-CM | POA: Insufficient documentation

## 2015-04-04 DIAGNOSIS — F410 Panic disorder [episodic paroxysmal anxiety] without agoraphobia: Secondary | ICD-10-CM | POA: Insufficient documentation

## 2015-04-04 DIAGNOSIS — N390 Urinary tract infection, site not specified: Secondary | ICD-10-CM | POA: Insufficient documentation

## 2015-04-04 LAB — CBC WITH DIFFERENTIAL/PLATELET
BASOS PCT: 0 % (ref 0–1)
Basophils Absolute: 0.1 10*3/uL (ref 0.0–0.1)
Eosinophils Absolute: 0.1 10*3/uL (ref 0.0–0.7)
Eosinophils Relative: 1 % (ref 0–5)
HCT: 41.6 % (ref 36.0–46.0)
HEMOGLOBIN: 13.5 g/dL (ref 12.0–15.0)
LYMPHS ABS: 1.8 10*3/uL (ref 0.7–4.0)
LYMPHS PCT: 16 % (ref 12–46)
MCH: 28.5 pg (ref 26.0–34.0)
MCHC: 32.5 g/dL (ref 30.0–36.0)
MCV: 87.8 fL (ref 78.0–100.0)
MONOS PCT: 5 % (ref 3–12)
Monocytes Absolute: 0.6 10*3/uL (ref 0.1–1.0)
NEUTROS PCT: 78 % — AB (ref 43–77)
Neutro Abs: 9.2 10*3/uL — ABNORMAL HIGH (ref 1.7–7.7)
Platelets: 289 10*3/uL (ref 150–400)
RBC: 4.74 MIL/uL (ref 3.87–5.11)
RDW: 14.8 % (ref 11.5–15.5)
WBC: 11.8 10*3/uL — ABNORMAL HIGH (ref 4.0–10.5)

## 2015-04-04 MED ORDER — SODIUM CHLORIDE 0.9 % IV BOLUS (SEPSIS)
1000.0000 mL | Freq: Once | INTRAVENOUS | Status: AC
  Administered 2015-04-04: 1000 mL via INTRAVENOUS

## 2015-04-04 MED ORDER — DIPHENHYDRAMINE HCL 50 MG/ML IJ SOLN
25.0000 mg | Freq: Once | INTRAMUSCULAR | Status: AC
  Administered 2015-04-04: 25 mg via INTRAVENOUS
  Filled 2015-04-04: qty 1

## 2015-04-04 MED ORDER — METOCLOPRAMIDE HCL 5 MG/ML IJ SOLN
10.0000 mg | Freq: Once | INTRAMUSCULAR | Status: AC
  Administered 2015-04-04: 10 mg via INTRAVENOUS
  Filled 2015-04-04: qty 2

## 2015-04-04 NOTE — ED Notes (Signed)
N/V starting yesterday. Emesis X12 per patient in the past 24 hours.

## 2015-04-04 NOTE — ED Provider Notes (Signed)
CSN: [id number]     Arrival date & time 04/04/15  2227 History  This chart was scribed for No att. providers found by Marica Otter, ED Scribe. This patient was seen in room APA10/APA10 and the patient's care was started at 11:09 PM.   Chief Complaint  Patient presents with  . Emesis   The history is provided by the patient. No language interpreter was used.   [facility]: [facility] [place]  HPI Comments: Brandy Hoover is a 34 y.o. female, who is a personal care aide, with PMHx noted below including gastritis(Dx 2-3 weeks ago), who presents to the Emergency Department complaining of atraumatic, intermittent, sharp abd pain with associated nausea and vomiting onset yesterday around 11PM. Pt notes the abd pain worsens with touch and pressure and laying on her left side. Nothing makes it feel better.  Pt reports about 12 episodes of non-bloody vomiting in the last 24 hours. Pt also complains of associated non-bloody diarrhea noting she had three episodes of diarrhea today involving loose and watery stool. Pt reports taking pepto bismol for her Sx without relief. Pt is uncertain whether she had a fever though notes she felt warm earlier today. Pt denies Hx of similar Sx, however, notes with gastritis flare-up she was  experiences vomiting without abdominal pain. Pt denies sick contacts, ingesting any new foods, tobacco use, EtOH use, pregnancy, dizziness, lightheaded (though pt notes she was lightheaded last night). Pt reports her daily meds are zantac which was started 2-3 weeks ago when she was diagnosed with the gastritis, prenatal vitamins and celexa only.   PCP [facility]   Past Medical History  Diagnosis Date  . Panic attacks    Past Surgical History  Procedure Laterality Date  . Cholecystectomy     Family History  Problem Relation Age of Onset  . Diabetes Mother   . Hypertension Father   . Diabetes Father    History  Substance Use Topics  . Smoking  status: Never Smoker   . Smokeless tobacco: Never Used  . Alcohol Use: No   employed  OB History    Gravida Para Term Preterm AB TAB SAB Ectopic Multiple Living            0     Review of Systems  Constitutional: Positive for fever (subjective ).  Gastrointestinal: Positive for nausea, vomiting, abdominal pain and diarrhea. Negative for blood in stool.  Neurological: Negative for dizziness and light-headedness.  All other systems reviewed and are negative.  Allergies  Latex and Zithromax  Home Medications   Prior to Admission medications   Medication Sig Start Date End Date Taking? Authorizing Provider  acetaminophen (TYLENOL) 500 MG tablet Take 500 mg by mouth every 6 (six) hours as needed for mild pain or moderate pain.   Yes Historical Provider, MD  albuterol (PROVENTIL HFA;VENTOLIN HFA) 108 (90 BASE) MCG/ACT inhaler Inhale 1-2 puffs into the lungs every 6 (six) hours as needed for wheezing or shortness of breath.   Yes Historical Provider, MD  citalopram (CELEXA) 20 MG tablet Take 20 mg by mouth at bedtime.    Yes Historical Provider, MD  ibuprofen (ADVIL,MOTRIN) 200 MG tablet Take 400 mg by mouth every 8 (eight) hours as needed for headache.   Yes Historical Provider, MD  Prenatal Vit-Fe Fumarate-FA (PRENATAL MULTIVITAMIN) TABS tablet Take 1 tablet by mouth at bedtime.   Yes Historical Provider, MD  ranitidine (ZANTAC) 150 MG tablet Take 150 mg by mouth daily as  needed for heartburn.   Yes Historical Provider, MD  cephALEXin (KEFLEX) 500 MG capsule Take 1 capsule (500 mg total) by mouth 3 (three) times daily. 04/05/15   Devoria Albe, MD  ondansetron (ZOFRAN) 4 MG tablet Take 1 tablet (4 mg total) by mouth every 8 (eight) hours as needed for nausea or vomiting. 04/05/15   Devoria Albe, MD   Triage Vitals: BP 148/93 mmHg  Pulse 96  Temp(Src) 99.3 F (37.4 C) (Oral)  Resp 18  Ht  (1.575 m)  Wt 283 lb (128.368 kg)  BMI 51.75 kg/m2  SpO2 99%  LMP 03/04/2015  Vital signs normal    Physical Exam  Constitutional: She is oriented to person, place, and time. She appears well-developed and well-nourished.  Non-toxic appearance. She does not appear ill. No distress.  Obese   HENT:  Head: Normocephalic and atraumatic.  Right Ear: External ear normal.  Left Ear: External ear normal.  Nose: Nose normal. No mucosal edema or rhinorrhea.  Mouth/Throat: Oropharynx is clear and moist and mucous membranes are normal. No dental abscesses or uvula swelling.  Eyes: Conjunctivae and EOM are normal. Pupils are equal, round, and reactive to light.  Neck: Normal range of motion and full passive range of motion without pain. Neck supple.  Cardiovascular: Normal rate, regular rhythm and normal heart sounds.  Exam reveals no gallop and no friction rub.   No murmur heard. Pulmonary/Chest: Effort normal and breath sounds normal. No respiratory distress. She has no wheezes. She has no rhonchi. She has no rales. She exhibits no tenderness and no crepitus.  Abdominal: Soft. Normal appearance and bowel sounds are normal. She exhibits no distension. There is generalized tenderness (generalized upper abd pain; worse on left ). There is CVA tenderness. There is no rebound and no guarding.    Genitourinary:  Bilateral CVA tenderness with right worse than left.   Musculoskeletal: Normal range of motion. She exhibits no edema or tenderness.  Moves all extremities well.   Neurological: She is alert and oriented to person, place, and time. She has normal strength. No cranial nerve deficit.  Skin: Skin is warm, dry and intact. No rash noted. No erythema. No pallor.  Psychiatric: She has a normal mood and affect. Her speech is normal and behavior is normal. Her mood appears not anxious.  Nursing note and vitals reviewed.   ED Course  Procedures (including critical care time)  Medications  sodium chloride 0.9 % bolus 1,000 mL (0 mLs Intravenous Stopped 04/05/15 0133)  sodium chloride 0.9 % bolus  1,000 mL (0 mLs Intravenous Stopped 04/05/15 0133)  metoCLOPramide (REGLAN) injection 10 mg (10 mg Intravenous Given 04/04/15 2334)  diphenhydrAMINE (BENADRYL) injection 25 mg (25 mg Intravenous Given 04/04/15 2335)  cefTRIAXone (ROCEPHIN) 1 g in dextrose 5 % 50 mL IVPB (0 g Intravenous Stopped 04/05/15 0203)    DIAGNOSTIC STUDIES: Oxygen Saturation is 99% on RA, nl by my interpretation.    COORDINATION OF CARE: 11:17 PM-Discussed treatment plan which includes labs, fluids with pt at bedside and pt agreed to plan.   Patient was rechecked at 1 AM. Patient is sleeping. Her boyfriend was given her test results. She was given Rocephin for presumed UTI.  Patient was rechecked at time of discharge. She is smiling and states she feels much improved. She was given her test results again. She feels ready to be discharged.  Labs Review Results for orders placed or performed during the hospital encounter of 04/04/15  Comprehensive metabolic panel  Result Value Ref Range   Sodium 137 135 - 145 mmol/L   Potassium 3.7 3.5 - 5.1 mmol/L   Chloride 105 101 - 111 mmol/L   CO2 25 22 - 32 mmol/L   Glucose, Bld 80 65 - 99 mg/dL   BUN 12 6 - 20 mg/dL   Creatinine, Ser 7.82 0.44 - 1.00 mg/dL   Calcium 8.4 (L) 8.9 - 10.3 mg/dL   Total Protein 7.1 6.5 - 8.1 g/dL   Albumin 3.3 (L) 3.5 - 5.0 g/dL   AST 20 15 - 41 U/L   ALT 26 14 - 54 U/L   Alkaline Phosphatase 88 38 - 126 U/L   Total Bilirubin 0.6 0.3 - 1.2 mg/dL   GFR calc non Af Amer >60 >60 mL/min   GFR calc Af Amer >60 >60 mL/min   Anion gap 7 5 - 15  CBC with Differential  Result Value Ref Range   WBC 11.8 (H) 4.0 - 10.5 K/uL   RBC 4.74 3.87 - 5.11 MIL/uL   Hemoglobin 13.5 12.0 - 15.0 g/dL   HCT 95.6 21.3 - 08.6 %   MCV 87.8 78.0 - 100.0 fL   MCH 28.5 26.0 - 34.0 pg   MCHC 32.5 30.0 - 36.0 g/dL   RDW 57.8 46.9 - 62.9 %   Platelets 289 150 - 400 K/uL   Neutrophils Relative % 78 (H) 43 - 77 %   Neutro Abs 9.2 (H) 1.7 - 7.7 K/uL   Lymphocytes  Relative 16 12 - 46 %   Lymphs Abs 1.8 0.7 - 4.0 K/uL   Monocytes Relative 5 3 - 12 %   Monocytes Absolute 0.6 0.1 - 1.0 K/uL   Eosinophils Relative 1 0 - 5 %   Eosinophils Absolute 0.1 0.0 - 0.7 K/uL   Basophils Relative 0 0 - 1 %   Basophils Absolute 0.1 0.0 - 0.1 K/uL  Urinalysis, Routine w reflex microscopic (not at [facility])  Result Value Ref Range   Color, Urine YELLOW YELLOW   APPearance TURBID (A) CLEAR   Specific Gravity, Urine 1.015 1.005 - 1.030   pH 7.5 5.0 - 8.0   Glucose, UA NEGATIVE NEGATIVE mg/dL   Hgb urine dipstick SMALL (A) NEGATIVE   Bilirubin Urine NEGATIVE NEGATIVE   Ketones, ur NEGATIVE NEGATIVE mg/dL   Protein, ur NEGATIVE NEGATIVE mg/dL   Urobilinogen, UA 0.2 0.0 - 1.0 mg/dL   Nitrite POSITIVE (A) NEGATIVE   Leukocytes, UA SMALL (A) NEGATIVE  Pregnancy, urine  Result Value Ref Range   Preg Test, Ur NEGATIVE NEGATIVE  Lipase, blood  Result Value Ref Range   Lipase 12 (L) 22 - 51 U/L  Urine microscopic-add on  Result Value Ref Range   Squamous Epithelial / LPF MANY (A) RARE   WBC, UA TOO NUMEROUS TO COUNT <3 WBC/hpf   RBC / HPF 0-2 <3 RBC/hpf   Bacteria, UA MANY (A) RARE   Laboratory interpretation all normal except probable UTI, mild leukocytosis     Imaging Review No results found.   EKG Interpretation None      MDM   Final diagnoses:  Nausea vomiting and diarrhea  Dehydration  Urinary tract infection without hematuria, site unspecified    Discharge Medication List as of 04/05/2015  4:31 AM    START taking these medications   Details  cephALEXin (KEFLEX) 500 MG capsule Take 1 capsule (500 mg total) by mouth 3 (three) times daily., Starting 04/05/2015, Until Discontinued, Print    ondansetron [facility])  4 MG tablet Take 1 tablet (4 mg total) by mouth every 8 (eight) hours as needed for nausea or vomiting., Starting 04/05/2015, Until Discontinued, Print        Plan discharge  Devoria AlbeIva Della Homan, MD, FACEP   I personally performed the services  described in this documentation, which was scribed in my presence. The recorded information has been reviewed and considered.  Devoria AlbeIva Oluwademilade Mckiver, MD, [place] PyoFACEP    Shanty Ginty, MD 04/05/15 0600

## 2015-04-05 LAB — LIPASE, BLOOD: Lipase: 12 U/L — ABNORMAL LOW (ref 22–51)

## 2015-04-05 LAB — URINALYSIS, ROUTINE W REFLEX MICROSCOPIC
Bilirubin Urine: NEGATIVE
Glucose, UA: NEGATIVE mg/dL
Ketones, ur: NEGATIVE mg/dL
Nitrite: POSITIVE — AB
Protein, ur: NEGATIVE mg/dL
Specific Gravity, Urine: 1.015 (ref 1.005–1.030)
UROBILINOGEN UA: 0.2 mg/dL (ref 0.0–1.0)
pH: 7.5 (ref 5.0–8.0)

## 2015-04-05 LAB — COMPREHENSIVE METABOLIC PANEL
ALBUMIN: 3.3 g/dL — AB (ref 3.5–5.0)
ALT: 26 U/L (ref 14–54)
AST: 20 U/L (ref 15–41)
Alkaline Phosphatase: 88 U/L (ref 38–126)
Anion gap: 7 (ref 5–15)
BUN: 12 mg/dL (ref 6–20)
CALCIUM: 8.4 mg/dL — AB (ref 8.9–10.3)
CO2: 25 mmol/L (ref 22–32)
CREATININE: 0.7 mg/dL (ref 0.44–1.00)
Chloride: 105 mmol/L (ref 101–111)
GFR calc Af Amer: >60 mL/min (ref >60)
Glucose, Bld: 80 mg/dL (ref 65–99)
Potassium: 3.7 mmol/L (ref 3.5–5.1)
Sodium: 137 mmol/L (ref 135–145)
TOTAL PROTEIN: 7.1 g/dL (ref 6.5–8.1)
Total Bilirubin: 0.6 mg/dL (ref 0.3–1.2)

## 2015-04-05 LAB — PREGNANCY, URINE: PREG TEST UR: NEGATIVE

## 2015-04-05 LAB — URINE MICROSCOPIC-ADD ON

## 2015-04-05 MED ORDER — CEPHALEXIN 500 MG PO CAPS: 500.0000 mg | ORAL_CAPSULE | Freq: Three times a day (TID) | ORAL | Status: DC

## 2015-04-05 MED ORDER — ONDANSETRON HCL 4 MG PO TABS
4.0000 mg | ORAL_TABLET | Freq: Three times a day (TID) | ORAL | Status: DC | PRN
Start: 2015-04-05 — End: 2015-04-28

## 2015-04-05 MED ORDER — DEXTROSE 5 % IV SOLN
1.0000 g | Freq: Once | INTRAVENOUS | Status: AC
  Administered 2015-04-05: 1 g via INTRAVENOUS
  Filled 2015-04-05: qty 10

## 2015-04-05 NOTE — Discharge Instructions (Signed)
Drink plenty of fluids (clear liquids) the next 12-24 hours then start a bland diet. Use the zofran for nausea or vomiting. Take imodium OTC for diarrhea. Avoid mild products until the diarrhea is gone. Take the antibiotics until gone.  Nausea and Vomiting Nausea is a sick feeling that often comes before throwing up (vomiting). Vomiting is a reflex where stomach contents come out of your mouth. Vomiting can cause severe loss of body fluids (dehydration). Children and elderly adults can become dehydrated quickly, especially if they also have diarrhea. Nausea and vomiting are symptoms of a condition or disease. It is important to find the cause of your symptoms. CAUSES   Direct irritation of the stomach lining. This irritation can result from increased acid production (gastroesophageal reflux disease), infection, food poisoning, taking certain medicines (such as nonsteroidal anti-inflammatory drugs), alcohol use, or tobacco use.  Signals from the brain.These signals could be caused by a headache, heat exposure, an inner ear disturbance, increased pressure in the brain from injury, infection, a tumor, or a concussion, pain, emotional stimulus, or metabolic problems.  An obstruction in the gastrointestinal tract (bowel obstruction).  Illnesses such as diabetes, hepatitis, gallbladder problems, appendicitis, kidney problems, cancer, sepsis, atypical symptoms of a heart attack, or eating disorders.  Medical treatments such as chemotherapy and radiation.  Receiving medicine that makes you sleep (general anesthetic) during surgery. DIAGNOSIS Your caregiver may ask for tests to be done if the problems do not improve after a few days. Tests may also be done if symptoms are severe or if the reason for the nausea and vomiting is not clear. Tests may include:  Urine tests.  Blood tests.  Stool tests.  Cultures (to look for evidence of infection).  X-rays or other imaging studies. Test results can  help your caregiver make decisions about treatment or the need for additional tests. TREATMENT You need to stay well hydrated. Drink frequently but in small amounts.You may wish to drink water, sports drinks, clear broth, or eat frozen ice pops or gelatin dessert to help stay hydrated.When you eat, eating slowly may help prevent nausea.There are also some antinausea medicines that may help prevent nausea. HOME CARE INSTRUCTIONS   Take all medicine as directed by your caregiver.  If you do not have an appetite, do not force yourself to eat. However, you must continue to drink fluids.  If you have an appetite, eat a normal diet unless your caregiver tells you differently.  Eat a variety of complex carbohydrates (rice, wheat, potatoes, bread), lean meats, yogurt, fruits, and vegetables.  Avoid high-fat foods because they are more difficult to digest.  Drink enough water and fluids to keep your urine clear or pale yellow.  If you are dehydrated, ask your caregiver for specific rehydration instructions. Signs of dehydration may include:  Severe thirst.  Dry lips and mouth.  Dizziness.  Dark urine.  Decreasing urine frequency and amount.  Confusion.  Rapid breathing or pulse. SEEK IMMEDIATE MEDICAL CARE IF:   You have blood or brown flecks (like coffee grounds) in your vomit.  You have black or bloody stools.  You have a severe headache or stiff neck.  You are confused.  You have severe abdominal pain.  You have chest pain or trouble breathing.  You do not urinate at least once every 8 hours.  You develop cold or clammy skin.  You continue to vomit for longer than 24 to 48 hours.  You have a fever. MAKE SURE YOU:   Understand these  instructions.  Will watch your condition.  Will get help right away if you are not doing well or get worse. Document Released: 09/16/2005 Document Revised: 12/09/2011 Document Reviewed: 02/13/2011 [facility] Patient Information  2015 [place], [place]. This information is not intended to replace advice given to you by your health care provider. Make sure you discuss any questions you have with your health care provider.  Diarrhea Diarrhea is frequent loose and watery bowel movements. It can cause you to feel weak and dehydrated. Dehydration can cause you to become tired and thirsty, have a dry mouth, and have decreased urination that often is dark yellow. Diarrhea is a sign of another problem, most often an infection that will not last long. In most cases, diarrhea typically lasts 2-3 days. However, it can last longer if it is a sign of something more serious. It is important to treat your diarrhea as directed by your caregiver to lessen or prevent future episodes of diarrhea. CAUSES  Some common causes include:  Gastrointestinal infections caused by viruses, bacteria, or parasites.  Food poisoning or food allergies.  Certain medicines, such as antibiotics, chemotherapy, and laxatives.  Artificial sweeteners and fructose.  Digestive disorders. HOME CARE INSTRUCTIONS  Ensure adequate fluid intake (hydration): Have 1 cup (8 oz) of fluid for each diarrhea episode. Avoid fluids that contain simple sugars or sports drinks, fruit juices, whole milk products, and sodas. Your urine should be clear or pale yellow if you are drinking enough fluids. Hydrate with an oral rehydration solution that you can purchase at pharmacies, retail stores, and online. You can prepare an oral rehydration solution at home by mixing the following ingredients together:   - tsp table salt.   tsp baking soda.   tsp salt substitute containing potassium chloride.  1  tablespoons sugar.  1 L (34 oz) of water.  Certain foods and beverages may increase the speed at which food moves through the gastrointestinal (GI) tract. These foods and beverages should be avoided and include:  Caffeinated and alcoholic beverages.  High-fiber foods, such as raw  fruits and vegetables, nuts, seeds, and whole grain breads and cereals.  Foods and beverages sweetened with sugar alcohols, such as xylitol, sorbitol, and mannitol.  Some foods may be well tolerated and may help thicken stool including:  Starchy foods, such as rice, toast, pasta, low-sugar cereal, oatmeal, grits, baked potatoes, crackers, and bagels.  Bananas.  Applesauce.  Add probiotic-rich foods to help increase healthy bacteria in the GI tract, such as yogurt and fermented milk products.  Wash your hands well after each diarrhea episode.  Only take over-the-counter or prescription medicines as directed by your caregiver.  Take a warm bath to relieve any burning or pain from frequent diarrhea episodes. SEEK IMMEDIATE MEDICAL CARE IF:   You are unable to keep fluids down.  You have persistent vomiting.  You have blood in your stool, or your stools are black and tarry.  You do not urinate in 6-8 hours, or there is only a small amount of very dark urine.  You have abdominal pain that increases or localizes.  You have weakness, dizziness, confusion, or light-headedness.  You have a severe headache.  Your diarrhea gets worse or does not get better.  You have a fever or persistent symptoms for more than 2-3 days.  You have a fever and your symptoms suddenly get worse. MAKE SURE YOU:   Understand these instructions.  Will watch your condition.  Will get help right away if you are  not doing well or get worse. Document Released: 09/06/2002 Document Revised: 01/31/2014 Document Reviewed: 05/24/2012 [facility] Patient Information 2015 [place], [place]. This information is not intended to replace advice given to you by your health care provider. Make sure you discuss any questions you have with your health care provider.  Urinary Tract Infection A urinary tract infection (UTI) can occur any place along the urinary tract. The tract includes the kidneys, ureters, bladder, and  urethra. A type of germ called bacteria often causes a UTI. UTIs are often helped with antibiotic medicine.  HOME CARE   If given, take antibiotics as told by your doctor. Finish them even if you start to feel better.  Drink enough fluids to keep your pee (urine) clear or pale yellow.  Avoid tea, drinks with caffeine, and bubbly (carbonated) drinks.  Pee often. Avoid holding your pee in for a long time.  Pee before and after having sex (intercourse).  Wipe from front to back after you poop (bowel movement) if you are a woman. Use each tissue only once. GET HELP RIGHT AWAY IF:   You have back pain.  You have lower belly (abdominal) pain.  You have chills.  You feel sick to your stomach (nauseous).  You throw up (vomit).  Your burning or discomfort with peeing does not go away.  You have a fever.  Your symptoms are not better in 3 days. MAKE SURE YOU:   Understand these instructions.  Will watch your condition.  Will get help right away if you are not doing well or get worse. Document Released: 03/04/2008 Document Revised: 06/10/2012 Document Reviewed: 04/16/2012 [facility] Patient Information 2015 [place], [place]. This information is not intended to replace advice given to you by your health care provider. Make sure you discuss any questions you have with your health care provider. Recheck if you get worse.

## 2015-04-05 NOTE — ED Notes (Signed)
Patient verbalizes understanding of discharge instructions, prescription medications, home care and follow up care. Patient ambulatory out of department at this time with spouse. 

## 2015-04-28 ENCOUNTER — Emergency Department (HOSPITAL_COMMUNITY)
Admission: EM | Admit: 2015-04-28 | Discharge: 2015-04-28 | Disposition: A | Discharge status: Home / Self Care | Payer: Self-pay | Attending: Emergency Medicine | Admitting: Emergency Medicine

## 2015-04-28 ENCOUNTER — Encounter (HOSPITAL_COMMUNITY): Payer: Self-pay | Admitting: Emergency Medicine

## 2015-04-28 DIAGNOSIS — Z3202 Encounter for pregnancy test, result negative: Secondary | ICD-10-CM | POA: Insufficient documentation

## 2015-04-28 DIAGNOSIS — F410 Panic disorder [episodic paroxysmal anxiety] without agoraphobia: Secondary | ICD-10-CM | POA: Insufficient documentation

## 2015-04-28 DIAGNOSIS — H81399 Other peripheral vertigo, unspecified ear: Secondary | ICD-10-CM | POA: Insufficient documentation

## 2015-04-28 DIAGNOSIS — Z91040 Latex allergy status: Secondary | ICD-10-CM | POA: Insufficient documentation

## 2015-04-28 DIAGNOSIS — Z8679 Personal history of other diseases of the circulatory system: Secondary | ICD-10-CM | POA: Insufficient documentation

## 2015-04-28 HISTORY — DX: Dizziness and giddiness: R42

## 2015-04-28 HISTORY — DX: Migraine, unspecified, not intractable, without status migrainosus: G43.909

## 2015-04-28 LAB — I-STAT CHEM 8, ED
BUN: 9 mg/dL (ref 6–20)
Calcium, Ion: 1.11 mmol/L — ABNORMAL LOW (ref 1.12–1.23)
Chloride: 106 mmol/L (ref 101–111)
Creatinine, Ser: 0.6 mg/dL (ref 0.44–1.00)
GLUCOSE: 96 mg/dL (ref 65–99)
HCT: 37 % (ref 36.0–46.0)
Hemoglobin: 12.6 g/dL (ref 12.0–15.0)
Potassium: 3.9 mmol/L (ref 3.5–5.1)
Sodium: 139 mmol/L (ref 135–145)
TCO2: 23 mmol/L (ref 0–100)

## 2015-04-28 LAB — I-STAT BETA HCG BLOOD, ED (MC, WL, AP ONLY): I-stat hCG, quantitative: <5 m[IU]/mL (ref <5)

## 2015-04-28 MED ORDER — ONDANSETRON 8 MG PO TBDP
8.0000 mg | ORAL_TABLET | Freq: Once | ORAL | Status: AC
  Administered 2015-04-28: 8 mg via ORAL
  Filled 2015-04-28: qty 1

## 2015-04-28 MED ORDER — MECLIZINE HCL 12.5 MG PO TABS
50.0000 mg | ORAL_TABLET | Freq: Once | ORAL | Status: AC
  Administered 2015-04-28: 50 mg via ORAL
  Filled 2015-04-28: qty 4

## 2015-04-28 MED ORDER — ONDANSETRON HCL 4 MG PO TABS: 4.0000 mg | ORAL_TABLET | Freq: Three times a day (TID) | ORAL | Status: DC | PRN

## 2015-04-28 MED ORDER — MECLIZINE HCL 25 MG PO TABS: 25.0000 mg | ORAL_TABLET | Freq: Three times a day (TID) | ORAL | Status: DC | PRN

## 2015-04-28 NOTE — ED Notes (Signed)
Pt ambulated to bathroom with no difficulties noted. Pt denies nausea or dizziness at this time.

## 2015-04-28 NOTE — ED Provider Notes (Signed)
CSN: [id number]     Arrival date & time 04/28/15  1031 History   First MD Initiated Contact with Patient 04/28/15 1109     Chief Complaint  Patient presents with  . Dizziness      HPI Pt was seen at 1115. Per pt, c/o sudden onset and persistence of constant "dizziness" that began this morning PTA. Pt states she was laying in bed when her symptoms began. Describes the dizziness as "everything is spinning" and "it's my vertigo." Has been associated with several episodes of N/V. States she "used to take Antivert" but did not have any more at home. Denies CP/palpitations, no SOB/cough, no abd pain, no diarrhea, no black or blood in emesis, no headache, no visual changes, no focal motor weakness, no tingling/numbness in extremities, no ataxia, no slurred speech, no facial droop.     Past Medical History  Diagnosis Date  . Panic attacks   . Vertigo   . Migraine headache    Past Surgical History  Procedure Laterality Date  . Cholecystectomy     Family History  Problem Relation Age of Onset  . Diabetes Mother   . Hypertension Father   . Diabetes Father    History  Substance Use Topics  . Smoking status: Never Smoker   . Smokeless tobacco: Never Used  . Alcohol Use: No    Review of Systems ROS: Statement: All systems negative except as marked or noted in the HPI; Constitutional: Negative for fever and chills. ; ; Eyes: Negative for eye pain, redness and discharge. ; ; ENMT: Negative for ear pain, hoarseness, nasal congestion, sinus pressure and sore throat. ; ; Cardiovascular: Negative for chest pain, palpitations, diaphoresis, dyspnea and peripheral edema. ; ; Respiratory: Negative for cough, wheezing and stridor. ; ; Gastrointestinal: +N/V. Negative for diarrhea, abdominal pain, blood in stool, hematemesis, jaundice and rectal bleeding. . ; ; Genitourinary: Negative for dysuria, flank pain and hematuria. ; ; Musculoskeletal: Negative for back pain and neck pain. Negative for swelling  and trauma.; ; Skin: Negative for pruritus, rash, abrasions, blisters, bruising and skin lesion.; ; Neuro: +vertigo. Negative for headache, lightheadedness and neck stiffness. Negative for weakness, altered level of consciousness , altered mental status, extremity weakness, paresthesias, involuntary movement, seizure and syncope.      Allergies  Latex and Zithromax  Home Medications   Prior to Admission medications   Medication Sig Start Date End Date Taking? Authorizing Provider  citalopram (CELEXA) 20 MG tablet Take 20 mg by mouth at bedtime.    Yes Historical Provider, MD  Prenatal Vit-Fe Fumarate-FA (PRENATAL MULTIVITAMIN) TABS tablet Take 1 tablet by mouth at bedtime.   Yes Historical Provider, MD  acetaminophen (TYLENOL) 500 MG tablet Take 500 mg by mouth every 6 (six) hours as needed for mild pain or moderate pain.    Historical Provider, MD  albuterol (PROVENTIL HFA;VENTOLIN HFA) 108 (90 BASE) MCG/ACT inhaler Inhale 1-2 puffs into the lungs every 6 (six) hours as needed for wheezing or shortness of breath.    Historical Provider, MD  cephALEXin (KEFLEX) 500 MG capsule Take 1 capsule (500 mg total) by mouth 3 (three) times daily. Patient not taking: Reported on 04/28/2015 04/05/15   Devoria Albe, MD  ibuprofen (ADVIL,MOTRIN) 200 MG tablet Take 400 mg by mouth every 8 (eight) hours as needed for headache.    Historical Provider, MD  ondansetron (ZOFRAN) 4 MG tablet Take 1 tablet (4 mg total) by mouth every 8 (eight) hours as needed for nausea or  vomiting. Patient not taking: Reported on 04/28/2015 04/05/15   Devoria Albe, MD  ranitidine (ZANTAC) 150 MG tablet Take 150 mg by mouth daily as needed for heartburn.    Historical Provider, MD   BP 129/71 mmHg  Pulse 70  Temp(Src) 98.1 F (36.7 C) (Oral)  Resp 18  Ht  (1.575 m)  Wt 280 lb (127.007 kg)  BMI 51.20 kg/m2  SpO2 100%  LMP 04/14/2015 Physical Exam  1120: Physical examination:  Nursing notes reviewed; Vital signs and O2 SAT  reviewed;  Constitutional: Well developed, Well nourished, Well hydrated, In no acute distress; Head:  Normocephalic, atraumatic; Eyes: EOMI, PERRL, No scleral icterus; ENMT: TM's clear bilat. +edemetous nasal turbinates bilat with clear rhinorrhea. Mouth and pharynx normal, Mucous membranes moist; Neck: Supple, Full range of motion, No lymphadenopathy; Cardiovascular: Regular rate and rhythm, No murmur, rub, or gallop; Respiratory: Breath sounds clear & equal bilaterally, No rales, rhonchi, wheezes.  Speaking full sentences with ease, Normal respiratory effort/excursion; Chest: Nontender, Movement normal; Abdomen: Soft, Nontender, Nondistended, Normal bowel sounds; Genitourinary: No CVA tenderness; Extremities: Pulses normal, No tenderness, No edema, No calf edema or asymmetry.; Neuro: AA&Ox3, Major CN grossly intact. Speech clear.  No facial droop. +right horizontal end gaze fatigable nystagmus which reproduces pt's symptoms. Grips equal. Strength 5/5 equal bilat UE's and LE's.  DTR 2/4 equal bilat UE's and LE's.  No gross sensory deficits.  Normal cerebellar testing bilat UE's (finger-nose) and LE's (heel-shin). ; Skin: Color normal, Warm, Dry.   ED Course  Procedures     EKG Interpretation None      MDM  MDM Reviewed: previous chart, nursing note and vitals Reviewed previous: labs Interpretation: labs      Results for orders placed or performed during the hospital encounter of 04/28/15  I-Stat beta hCG blood, ED  Result Value Ref Range   I-stat hCG, quantitative <5.0 <5 mIU/mL   Comment 3          I-stat Chem 8, ED  Result Value Ref Range   Sodium 139 135 - 145 mmol/L   Potassium 3.9 3.5 - 5.1 mmol/L   Chloride 106 101 - 111 mmol/L   BUN 9 6 - 20 mg/dL   Creatinine, Ser 1.61 0.44 - 1.00 mg/dL   Glucose, Bld 96 65 - 99 mg/dL   Calcium, Ion 0.96 (L) 1.12 - 1.23 mmol/L   TCO2 23 0 - 100 mmol/L   Hemoglobin 12.6 12.0 - 15.0 g/dL   HCT 04.5 40.9 - 81.1 %    1300:  Pt has  ambulated with steady gait, easy resps, NAD. States she "feels better now" and wants to go home. Dx and testing d/w pt and family.  Questions answered.  Verb understanding, agreeable to d/c home with outpt f/u.     Samuel Jester, DO 05/01/15 1223

## 2015-04-28 NOTE — ED Notes (Signed)
Went to check on pt, pt sleeping at this time.

## 2015-04-28 NOTE — Discharge Instructions (Signed)
°Emergency Department Resource Guide °1) Find a Doctor and Pay Out of Pocket °Although you won't have to find out who is covered by your insurance plan, it is a good idea to ask around and get recommendations. You will then need to call the office and see if the doctor you have chosen will accept you as a new patient and what types of options they offer for patients who are self-pay. Some doctors offer discounts or will set up payment plans for their patients who do not have insurance, but you will need to ask so you aren't surprised when you get to your appointment. ° °2) Contact Your Local Health Department °Not all health departments have doctors that can see patients for sick visits, but many do, so it is worth a call to see if yours does. If you don't know where your local health department is, you can check in your phone book. The CDC also has a tool to help you locate your state's health department, and many state websites also have listings of all of their local health departments. ° °3) Find a Walk-in Clinic °If your illness is not likely to be very severe or complicated, you may want to try a walk in clinic. These are popping up all over the country in pharmacies, drugstores, and shopping centers. They're usually staffed by nurse practitioners or physician assistants that have been trained to treat common illnesses and complaints. They're usually fairly quick and inexpensive. However, if you have serious medical issues or chronic medical problems, these are probably not your best option. ° °No Primary Care Doctor: °- Call [facility] at  832-8000 - they can help you locate a primary care doctor that  accepts your insurance, provides certain services, etc. °- Physician Referral Service- 1-800-533-3463 ° °Chronic Pain Problems: °Organization         Address  Phone   Notes  °[facility]  (336) 297-2271 Patients need to be referred by their primary care doctor.  ° °Medication  Assistance: °Organization         Address  Phone   Notes  °[place] [facility] [address] °[place], [place] [address] (336) 641-8030 --Must be a resident of [place] °-- Must have NO insurance coverage whatsoever (no Medicaid/ Medicare, etc.) °-- The pt. MUST have a primary care doctor that directs their care regularly and follows them in the community °  °[facility]  (866) 331-1348   °[employer]  (888) 892-1162   ° °Agencies that provide inexpensive medical care: °Organization         Address  Phone   Notes  °[facility]  (336) 832-8035   °[facility]    (336) 832-7272   °[facility] [address] °[place], [place] [address] (336) 832-4777   °[facility] [address], °[place] (336) 271-4999   °[facility]    (336) 373-0678   °[facility]    (336) 272-1050   °[facility] ° [address], [place] Phone:  (336) 832-4444, Fax:  (336) 832-4440 Hours of Operation:  9 am - 6 pm, M-F.  Also accepts Medicaid/Medicare and self-pay.  °[facility] ° [address], [place] Phone: (336) 832-3150, Fax: (336) [phone]. Hours of Operation:  8:30 am - 5:30 pm, M-F.  Also accepts Medicaid and self-pay.  °[facility] [place] [address], [place] Phone: (336) 878-6027   °[facility] [address], [place], [place] (336)723-1848, Ext. 123 Mondays & Thursdays: 7-9 AM.  First 15 patients are seen on a first come, first serve basis. °  ° °Medicaid-accepting [place] Providers: ° °Organization         Address  Phone   Notes  °[facility] [address], [place] (336) 641-2100 Also accepts self-pay patients.  °[facility] [address], [place] ° (336) 856-9996   °[facility] [address], [place]  (336) 288-8857   °[facility] [address] [place] [address], [place] (336) 299-7000   °Veita Bland [address], [place]  ° (336) 373-1557 Only accepts [place] Access Medicaid patients after they have their name applied to their card.  ° °Self-Pay (no insurance) in [place]: ° °Organization         Address  Phone   Notes  °Sickle Cell Patients, [facility] [address], [place] (336) 832-1970   °[facility] [address], [place] (336) 832-4400   °[facility] ° [address], [place] (336) 992-4800   °[facility]/Dr. Osei-Bonsu ° [address] [place] [address], [place] or [address], [place] (336) 841-8500 Phone number for both [place] and [place] locations is the same.  °[facility] [address], [place] (336) 299-0000   °[facility] [address] [place] [address], [place] or [address] (336) 852-7530 °(336) 878-2260   °[facility] [address], [place] (336) 350-1642, phone; (336) 294-5005, fax Sees patients 1st and 3rd Saturday of every month.  Must not qualify for public or private insurance (i.e. Medicaid, Medicare, [place] Health Choice, Veterans' Benefits) • Household income should be no more than 200% of the poverty level •The clinic cannot treat you if you are pregnant or think you are pregnant • Sexually transmitted diseases are not treated at the clinic.  ° ° °Dental Care: °Organization         Address  Phone  Notes  °[place] [facility] [address], [place] (336) 641-6152 Accepts children up to age 21 who are enrolled in Medicaid or [place] Health Choice; pregnant women with a Medicaid card; and children who have applied for Medicaid or [place] Health Choice, but were declined, whose parents can pay a reduced fee at time of service.  °[place]  [facility] [place]  [address], [place] (336) 641-7733 Accepts children up to age 21 who are enrolled in Medicaid or [place] Health Choice; pregnant women with a Medicaid card; and children who have applied for Medicaid or [place] Health Choice, but were declined, whose parents can pay a reduced fee at time of service.  °[facility] ° [address],  (336) 641-4533 Patients are seen by appointment only. Walk-ins are not accepted. [facility] will see patients 18 years of age and older. °Monday - Tuesday (8am-5pm) °Most Wednesdays (8:30-5pm) °$30 per visit, cash only  °[facility] ° [address], [place] (336) 641-4533 Patients are seen by appointment only. Walk-ins are not accepted. [facility] will see patients 18 years of age and older. °One   Wednesday Evening (Monthly: Volunteer Based).  $30 per visit, cash only  °[facility]  (919) 537-3737 for adults; Children under age 4, call Graduate Pediatric Dentistry at ([phone]) [phone]. Children aged 4-14, please call (919) [phone] to request a pediatric application. ° Dental services are provided in all areas of dental care including fillings, crowns and bridges, complete and partial dentures, implants, gum treatment, root canals, and extractions. Preventive care is also provided. Treatment is provided to both adults and children. °Patients are selected via a lottery and there is often a waiting list. °  °[facility] [address], °[place] ° (336) 763-8833 [email or web address] °  °[facility] [address], [place], [place] (336)723-1848, Ext. 123 Second and Fourth Thursday of each month, opens at 6:30 AM; Clinic ends at 9 AM.  Patients are seen on a first-come first-served basis, and a limited number are seen during each clinic.  ° °[facility] ° [address], [place], [place] (336) 723-7904    Eligibility Requirements °You must have lived in [place], [place], or [place] counties for at least the last three months. °  You cannot be eligible for state or federal sponsored healthcare insurance, including Veterans Administration, Medicaid, or Medicare. °  You generally cannot be eligible for healthcare insurance through your employer.  °  How to apply: °Eligibility screenings are held every Tuesday and Wednesday afternoon from 1:00 pm until 4:00 pm. You do not need an appointment for the interview!  °[facility] [address], [place], [place] 336-631-2330   °[facility]  336-342-8273   °[facility]  336-703-3100   °[facility]  336-570-6415   ° °Behavioral Health Resources in the Community: °Intensive Outpatient Programs °Organization         Address  Phone  Notes  °[facility] [address], [place], [place] 336-878-6098   °[facility] [address], [place], [place] 336-832-9800   °[facility] [address], [place], [place] ° 336-882-2125   °[facility] [address],  °[place], [place] 1-800-853-5163 or 336-641-4981   °Substance Abuse Resources °Organization         Address  Phone  Notes  °[facility]  336-882-2125   °[facility]  336-784-9470   °[facility]  336-285-9073   °[facility]  336-845-3988   °Residential & Outpatient Substance Abuse Program  1-800-659-3381   °Psychological Services °Organization         Address  Phone  Notes  °[facility]  336- 832-9600   °[facility]  336- 378-7881   °[facility] [address], [place] 1-800-853-5163 or 336-641-4981   ° °Mobile Crisis Teams °Organization         Address  Phone  Notes  °[facility], Mobile Crisis Care Unit  1-877-626-1772   °Assertive °[facility] ° [address]  [place], [place] 336-834-9664   °Sharon DeEsch [address] °[place] 336-554-5454   ° °Self-Help/Support Groups °Organization         Address  Phone             Notes  °[facility]  - variety of support groups  336- 373-1402 Call for more information  °Narcotics Anonymous (NA), [facility] [address], °[place]   2 meetings at this location  ° °  Residential Treatment Programs °Organization         Address  Phone  Notes  °[facility] [address],    °[place]  1-866-801-8205   °[facility] ° [address], [place], [place] 704-293-8524   °[facility] [address], [place] 336-845-3988 Admissions: 8am-3pm M-F  °[facility] [address]    °[place], [place] 336-841-1104   °[facility] [address], [place], [place] 336-379-7146   °[facility] [address]  °[place], [place] 336-285-9073   °[facility] Intensive Outpatient [address], [place], [place] 336-852-3033   °[facility] [address]  °[place], [place] 1-877-615-2722 or 336-784-9470   °[facility] [address] [place], [place] 336-227-7417 Accepts Medicaid  °[facility] [address]  °[place] 1-800-659-3381 Substance Abuse/Addiction Treatment  ° °[place] Behavioral Health Resources °Organization         Address  Phone  Notes  °[facility]  (888) 581-9988   °Julie Brannon, PhD [address] [place], [place]   (336) 349-5553 or (336) 951-0000   °[facility]   [address] °[place], [place] (336) 349-4454   °[facility] [address], [place], [place] (336) 342-8316 Insurance/Medicaid/sponsorship through [facility]  °[facility] [address]                                    [place], [place] (336) 342-8316 Therapy/tele-psych/case    °[facility] [address]  ° [place],  (336) 349-2233    °Dr. Arfeen  (336) 349-4544   °[facility] [place]  [employer] [place] [facility] 1) [address], [place] °2) [address], [place] °3)  [address], [place] (336) 349-3220 °(336) 342-7768 ° °(336) 342-8140   °[place] Child Abuse [facility] (336) 342-1394 or (336) 342-3537 (After Hours)    ° ° °Take the prescriptions as directed.  Call your regular medical doctor today to schedule a follow up appointment within the next 3 days.  Return to the Emergency Department immediately sooner if worsening.  ° °

## 2015-04-28 NOTE — ED Notes (Signed)
Per pt she states she feels like the room is spinning and she is spinning. Pt reports she feels nausea is r/t dizziness. Pt reports hx of migraines. Denies any recent head injury.

## 2015-04-28 NOTE — ED Notes (Signed)
PT stated she woke up this morning around 0830 with dizziness and nausea with vomiting x3 today pt also c/o slight headache.

## 2015-06-28 ENCOUNTER — Emergency Department (HOSPITAL_COMMUNITY): Payer: No Typology Code available for payment source

## 2015-06-28 ENCOUNTER — Encounter (HOSPITAL_COMMUNITY): Payer: Self-pay

## 2015-06-28 ENCOUNTER — Emergency Department (HOSPITAL_COMMUNITY)
Admission: EM | Admit: 2015-06-28 | Discharge: 2015-06-28 | Disposition: A | Discharge status: Home / Self Care | Payer: No Typology Code available for payment source | Attending: Emergency Medicine | Admitting: Emergency Medicine

## 2015-06-28 DIAGNOSIS — Z8679 Personal history of other diseases of the circulatory system: Secondary | ICD-10-CM | POA: Diagnosis not present

## 2015-06-28 DIAGNOSIS — F410 Panic disorder [episodic paroxysmal anxiety] without agoraphobia: Secondary | ICD-10-CM | POA: Diagnosis not present

## 2015-06-28 DIAGNOSIS — S161XXA Strain of muscle, fascia and tendon at neck level, initial encounter: Secondary | ICD-10-CM | POA: Diagnosis not present

## 2015-06-28 DIAGNOSIS — Z91040 Latex allergy status: Secondary | ICD-10-CM | POA: Diagnosis not present

## 2015-06-28 DIAGNOSIS — S39012A Strain of muscle, fascia and tendon of lower back, initial encounter: Secondary | ICD-10-CM | POA: Insufficient documentation

## 2015-06-28 DIAGNOSIS — S20312A Abrasion of left front wall of thorax, initial encounter: Secondary | ICD-10-CM | POA: Diagnosis not present

## 2015-06-28 DIAGNOSIS — Y92410 Unspecified street and highway as the place of occurrence of the external cause: Secondary | ICD-10-CM | POA: Insufficient documentation

## 2015-06-28 DIAGNOSIS — Y998 Other external cause status: Secondary | ICD-10-CM | POA: Insufficient documentation

## 2015-06-28 DIAGNOSIS — Z79899 Other long term (current) drug therapy: Secondary | ICD-10-CM | POA: Diagnosis not present

## 2015-06-28 DIAGNOSIS — Z3202 Encounter for pregnancy test, result negative: Secondary | ICD-10-CM | POA: Insufficient documentation

## 2015-06-28 DIAGNOSIS — S199XXA Unspecified injury of neck, initial encounter: Secondary | ICD-10-CM | POA: Diagnosis present

## 2015-06-28 DIAGNOSIS — Y9389 Activity, other specified: Secondary | ICD-10-CM | POA: Diagnosis not present

## 2015-06-28 LAB — POC URINE PREG, ED: PREG TEST UR: NEGATIVE

## 2015-06-28 MED ORDER — HYDROCODONE-ACETAMINOPHEN 5-325 MG PO TABS
1.0000 | ORAL_TABLET | Freq: Once | ORAL | Status: AC
  Administered 2015-06-28: 1 via ORAL
  Filled 2015-06-28: qty 1

## 2015-06-28 MED ORDER — CYCLOBENZAPRINE HCL 10 MG PO TABS
10.0000 mg | ORAL_TABLET | Freq: Once | ORAL | Status: AC
  Administered 2015-06-28: 10 mg via ORAL
  Filled 2015-06-28: qty 1

## 2015-06-28 MED ORDER — HYDROCODONE-ACETAMINOPHEN 5-325 MG PO TABS: 1.0000 | ORAL_TABLET | ORAL | Status: DC | PRN

## 2015-06-28 MED ORDER — CYCLOBENZAPRINE HCL 5 MG PO TABS: 5.0000 mg | ORAL_TABLET | Freq: Three times a day (TID) | ORAL | Status: DC | PRN

## 2015-06-28 MED ORDER — IBUPROFEN 600 MG PO TABS: 600.0000 mg | ORAL_TABLET | Freq: Four times a day (QID) | ORAL | Status: DC | PRN
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
## 2015-10-03 ENCOUNTER — Encounter (HOSPITAL_COMMUNITY): Payer: Self-pay | Admitting: *Deleted

## 2015-10-03 ENCOUNTER — Emergency Department (HOSPITAL_COMMUNITY): Payer: Self-pay

## 2015-10-03 ENCOUNTER — Emergency Department (HOSPITAL_COMMUNITY)
Admission: EM | Admit: 2015-10-03 | Discharge: 2015-10-03 | Disposition: A | Discharge status: Home / Self Care | Payer: Self-pay | Attending: Emergency Medicine | Admitting: Emergency Medicine

## 2015-10-03 DIAGNOSIS — S40022A Contusion of left upper arm, initial encounter: Secondary | ICD-10-CM | POA: Insufficient documentation

## 2015-10-03 DIAGNOSIS — Y998 Other external cause status: Secondary | ICD-10-CM | POA: Insufficient documentation

## 2015-10-03 DIAGNOSIS — Y92410 Unspecified street and highway as the place of occurrence of the external cause: Secondary | ICD-10-CM | POA: Insufficient documentation

## 2015-10-03 DIAGNOSIS — Y9389 Activity, other specified: Secondary | ICD-10-CM | POA: Insufficient documentation

## 2015-10-03 DIAGNOSIS — S0990XA Unspecified injury of head, initial encounter: Secondary | ICD-10-CM | POA: Insufficient documentation

## 2015-10-03 DIAGNOSIS — Z8659 Personal history of other mental and behavioral disorders: Secondary | ICD-10-CM | POA: Insufficient documentation

## 2015-10-03 DIAGNOSIS — Z8679 Personal history of other diseases of the circulatory system: Secondary | ICD-10-CM | POA: Insufficient documentation

## 2015-10-03 DIAGNOSIS — Z91040 Latex allergy status: Secondary | ICD-10-CM | POA: Insufficient documentation

## 2015-10-03 DIAGNOSIS — Z79899 Other long term (current) drug therapy: Secondary | ICD-10-CM | POA: Insufficient documentation

## 2015-10-03 MED ORDER — CYCLOBENZAPRINE HCL 5 MG PO TABS: 5.0000 mg | ORAL_TABLET | Freq: Three times a day (TID) | ORAL | Status: AC | PRN

## 2015-10-03 MED ORDER — HYDROCODONE-ACETAMINOPHEN 5-325 MG PO TABS
1.0000 | ORAL_TABLET | Freq: Once | ORAL | Status: AC
  Administered 2015-10-03: 1 via ORAL
  Filled 2015-10-03: qty 1

## 2015-10-03 MED ORDER — HYDROCODONE-ACETAMINOPHEN 5-325 MG PO TABS: 1.0000 | ORAL_TABLET | ORAL | Status: AC | PRN
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
## 2015-10-12 ENCOUNTER — Other Ambulatory Visit (HOSPITAL_COMMUNITY): Payer: Self-pay | Admitting: *Deleted

## 2015-10-12 DIAGNOSIS — R519 Headache, unspecified: Secondary | ICD-10-CM

## 2015-10-12 DIAGNOSIS — R51 Headache: Principal | ICD-10-CM

## 2015-10-13 ENCOUNTER — Ambulatory Visit (HOSPITAL_COMMUNITY)
Admission: RE | Admit: 2015-10-13 | Discharge: 2015-10-13 | Disposition: A | Discharge status: Home / Self Care | Payer: Self-pay | Source: Ambulatory Visit | Attending: *Deleted | Admitting: *Deleted

## 2015-10-13 DIAGNOSIS — R519 Headache, unspecified: Secondary | ICD-10-CM

## 2015-10-13 DIAGNOSIS — R51 Headache: Secondary | ICD-10-CM | POA: Insufficient documentation
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
